# Patient Record
Sex: Female | Born: 1960 | Race: White | Hispanic: No | Marital: Married | Smoking: Never smoker
Health system: Southern US, Community
[De-identification: ages and names within clinical notes are randomized; demographics above are authoritative.]

## PROBLEM LIST (undated history)

## (undated) DIAGNOSIS — E079 Disorder of thyroid, unspecified: Secondary | ICD-10-CM

## (undated) DIAGNOSIS — N390 Urinary tract infection, site not specified: Secondary | ICD-10-CM

## (undated) DIAGNOSIS — E119 Type 2 diabetes mellitus without complications: Secondary | ICD-10-CM

## (undated) DIAGNOSIS — E785 Hyperlipidemia, unspecified: Secondary | ICD-10-CM

## (undated) DIAGNOSIS — Z87442 Personal history of urinary calculi: Secondary | ICD-10-CM

## (undated) DIAGNOSIS — I10 Essential (primary) hypertension: Secondary | ICD-10-CM

## (undated) HISTORY — PX: TONSILLECTOMY: SUR1361

## (undated) HISTORY — PX: URETHRA SURGERY: SHX824

## (undated) HISTORY — PX: TYMPANOSTOMY TUBE PLACEMENT: SHX32

---

## 1998-03-14 ENCOUNTER — Other Ambulatory Visit: Admission: RE | Admit: 1998-03-14 | Discharge: 1998-03-14 | Payer: Self-pay | Admitting: Obstetrics and Gynecology

## 1999-05-06 ENCOUNTER — Other Ambulatory Visit: Admission: RE | Admit: 1999-05-06 | Discharge: 1999-05-06 | Payer: Self-pay | Admitting: Obstetrics and Gynecology

## 2000-06-16 ENCOUNTER — Other Ambulatory Visit: Admission: RE | Admit: 2000-06-16 | Discharge: 2000-06-16 | Payer: Self-pay | Admitting: Obstetrics and Gynecology

## 2001-08-20 ENCOUNTER — Other Ambulatory Visit: Admission: RE | Admit: 2001-08-20 | Discharge: 2001-08-20 | Payer: Self-pay | Admitting: Obstetrics and Gynecology

## 2002-09-02 ENCOUNTER — Ambulatory Visit (HOSPITAL_COMMUNITY): Admission: RE | Admit: 2002-09-02 | Discharge: 2002-09-02 | Payer: Self-pay | Admitting: *Deleted

## 2002-09-02 ENCOUNTER — Encounter (INDEPENDENT_AMBULATORY_CARE_PROVIDER_SITE_OTHER): Payer: Self-pay | Admitting: *Deleted

## 2004-11-16 ENCOUNTER — Encounter: Admission: RE | Admit: 2004-11-16 | Discharge: 2004-11-16 | Payer: Self-pay | Admitting: *Deleted

## 2009-12-15 ENCOUNTER — Emergency Department (HOSPITAL_COMMUNITY): Admission: EM | Admit: 2009-12-15 | Discharge: 2009-12-16 | Payer: Self-pay | Admitting: Emergency Medicine

## 2010-05-29 LAB — CBC
HCT: 40.1 % (ref 36.0–46.0)
Hemoglobin: 14.1 g/dL (ref 12.0–15.0)
MCH: 34.5 pg — ABNORMAL HIGH (ref 26.0–34.0)
MCHC: 35.1 g/dL (ref 30.0–36.0)
MCV: 98.2 fL (ref 78.0–100.0)

## 2010-05-29 LAB — URINALYSIS, ROUTINE W REFLEX MICROSCOPIC
Ketones, ur: NEGATIVE mg/dL
Leukocytes, UA: NEGATIVE
Nitrite: NEGATIVE
Protein, ur: 100 mg/dL — AB
Urobilinogen, UA: 0.2 mg/dL (ref 0.0–1.0)

## 2010-05-29 LAB — COMPREHENSIVE METABOLIC PANEL
ALT: 31 U/L (ref 0–35)
BUN: 28 mg/dL — ABNORMAL HIGH (ref 6–23)
CO2: 23 mEq/L (ref 19–32)
Calcium: 9.4 mg/dL (ref 8.4–10.5)
GFR calc non Af Amer: 45 mL/min — ABNORMAL LOW (ref >60)
Glucose, Bld: 213 mg/dL — ABNORMAL HIGH (ref 70–99)
Sodium: 138 mEq/L (ref 135–145)

## 2010-05-29 LAB — D-DIMER, QUANTITATIVE: D-Dimer, Quant: 0.54 ug/mL-FEU — ABNORMAL HIGH (ref 0.00–0.48)

## 2010-05-29 LAB — DIFFERENTIAL
Basophils Relative: 0 % (ref 0–1)
Eosinophils Absolute: 0 10*3/uL (ref 0.0–0.7)
Lymphs Abs: 0.9 10*3/uL (ref 0.7–4.0)
Neutro Abs: 10.7 10*3/uL — ABNORMAL HIGH (ref 1.7–7.7)
Neutrophils Relative %: 87 % — ABNORMAL HIGH (ref 43–77)

## 2010-05-29 LAB — POCT PREGNANCY, URINE: Preg Test, Ur: NEGATIVE

## 2010-08-02 NOTE — Op Note (Signed)
Elizabeth Hendricks, Elizabeth Hendricks                           ACCOUNT NO.:  [id number]   MEDICAL RECORD NO.:  [id number]                   PATIENT TYPE:  AMB   LOCATION:  DAY                                  FACILITY:  [facility]   PHYSICIAN:  Pershing Cox, M.D.            DATE OF BIRTH:  18-Nov-1960   DATE OF PROCEDURE:  09/02/2002  DATE OF DISCHARGE:                                 OPERATIVE REPORT   .   PREOPERATIVE DIAGNOSIS:  Polyp appearing from the cervix.   POSTOPERATIVE DIAGNOSIS:  Upper endocervical polyp.   PROCEDURE:  1. Exam under anesthesia.  2. Fractional D&C.  3. Diagnostic hysteroscopy.   ANESTHESIA:  General by LMA.   SURGEON:  Pershing Cox, M.D.   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  She has been on Alesse as an oral  contraceptive.  One month ago, she developed heavy vaginal bleeding and was  seen at [facility]  She was sent to [place] for evaluation.  In my office, I was  able to discern a polyp coming from the cervix.  Because of significant  introital limitations as well as vaginismus, I was not able to perform an  office procedure.  The patient is brought to the operating room today for  excision of this polyp and hysteroscopy.   FINDINGS:  Polypoid mass arising from the upper endocervix.  The uterine  cavity sounded to three inches.  There was polypoid endometrium lining the  endometrial cavity.  No other abnormalities were found.   DESCRIPTION OF PROCEDURE:  Elizabeth Hendricks is brought to the operating room with  an IV in place.  She had received 1 g of Ancef in the holding area.  Supine  on the OR table, IV sedation was administered, and then LMA was then placed  to protect her airway.  She was then placed into Allen stirrups and prepped  with a solution of Hibiclens and draped with sterile linens.  During the  prep of  her vagina, the major portion of the polyp was evulsed and removed  for later incorporation with the surgical specimen.   Tiny bivalve speculum was inserted into the vagina.  The cervix was  visualized, and 0.25% Marcaine was injected into the anterior cervix.  Marcaine paracervical block was administered to the 3, 4, 7, and 8 positions  using a total volume of 8 mL.  Kevorkian curette was used to obtain  endocervical curettings.  The uterine sound had passed to a depth of three  inches.  Using serial Pratt dilators, the cervix was dilated to size 25, and  the diagnostic scope was introduced.  Using through-and-through sorbitol  irrigation, the cavity was visualized, and photographs were made of the  polypoid endometrium.  Insertion site  of the polyp appeared to be coming  from the right upper endocervix.  The hysteroscope was removed and using a  small, sharp curette, the lower cervix was curetted, and the base of the  polyp was removed.  Next, the small, sharp curette was used to serially  curette the uterine wall, followed by a Meigs  curettage.  Using these two forms, the tissue collected onto Telfa was then  submitted as endometrial curettings.  The hysteroscope was re-introduced.  There were no additional findings.  The uterus sounded again to three  inches, and the procedure was terminated.                                               Pershing Cox, M.D.    MAJ/MEDQ  D:  09/02/2002  T:  09/02/2002  Job:  [id number]   cc:   Gabriel Earing, M.D.  [address]  [place]  [place] [address]  Fax: [phone]

## 2016-06-18 ENCOUNTER — Ambulatory Visit
Admission: RE | Admit: 2016-06-18 | Discharge: 2016-06-18 | Disposition: A | Discharge status: Home / Self Care | Payer: BLUE CROSS/BLUE SHIELD | Source: Ambulatory Visit | Attending: Internal Medicine | Admitting: Internal Medicine

## 2016-06-18 ENCOUNTER — Other Ambulatory Visit: Payer: Self-pay | Admitting: Internal Medicine

## 2016-06-18 DIAGNOSIS — M25561 Pain in right knee: Secondary | ICD-10-CM

## 2016-06-18 DIAGNOSIS — M542 Cervicalgia: Secondary | ICD-10-CM

## 2016-06-18 DIAGNOSIS — M25511 Pain in right shoulder: Secondary | ICD-10-CM

## 2016-07-15 DIAGNOSIS — Z87442 Personal history of urinary calculi: Secondary | ICD-10-CM

## 2016-07-15 DIAGNOSIS — N390 Urinary tract infection, site not specified: Secondary | ICD-10-CM

## 2016-07-15 HISTORY — DX: Urinary tract infection, site not specified: N39.0

## 2016-07-15 HISTORY — DX: Personal history of urinary calculi: Z87.442

## 2016-08-11 ENCOUNTER — Emergency Department (HOSPITAL_COMMUNITY): Payer: BLUE CROSS/BLUE SHIELD

## 2016-08-11 ENCOUNTER — Encounter (HOSPITAL_COMMUNITY)
Admission: EM | Disposition: A | Discharge status: Home / Self Care | Payer: Self-pay | Source: Home / Self Care | Attending: Internal Medicine

## 2016-08-11 ENCOUNTER — Inpatient Hospital Stay (HOSPITAL_COMMUNITY)
Admission: EM | Admit: 2016-08-11 | Discharge: 2016-08-18 | DRG: 669 | Disposition: A | Discharge status: Home / Self Care | Payer: BLUE CROSS/BLUE SHIELD | Attending: Internal Medicine | Admitting: Internal Medicine

## 2016-08-11 ENCOUNTER — Encounter (HOSPITAL_COMMUNITY): Payer: Self-pay | Admitting: Emergency Medicine

## 2016-08-11 ENCOUNTER — Inpatient Hospital Stay (HOSPITAL_COMMUNITY): Payer: BLUE CROSS/BLUE SHIELD | Admitting: Certified Registered Nurse Anesthetist

## 2016-08-11 ENCOUNTER — Inpatient Hospital Stay (HOSPITAL_COMMUNITY): Payer: BLUE CROSS/BLUE SHIELD

## 2016-08-11 DIAGNOSIS — R52 Pain, unspecified: Secondary | ICD-10-CM

## 2016-08-11 DIAGNOSIS — W19XXXA Unspecified fall, initial encounter: Secondary | ICD-10-CM | POA: Diagnosis not present

## 2016-08-11 DIAGNOSIS — I10 Essential (primary) hypertension: Secondary | ICD-10-CM | POA: Diagnosis present

## 2016-08-11 DIAGNOSIS — R0602 Shortness of breath: Secondary | ICD-10-CM

## 2016-08-11 DIAGNOSIS — N211 Calculus in urethra: Secondary | ICD-10-CM | POA: Diagnosis present

## 2016-08-11 DIAGNOSIS — E785 Hyperlipidemia, unspecified: Secondary | ICD-10-CM | POA: Diagnosis present

## 2016-08-11 DIAGNOSIS — E039 Hypothyroidism, unspecified: Secondary | ICD-10-CM

## 2016-08-11 DIAGNOSIS — N132 Hydronephrosis with renal and ureteral calculous obstruction: Secondary | ICD-10-CM | POA: Diagnosis present

## 2016-08-11 DIAGNOSIS — R112 Nausea with vomiting, unspecified: Secondary | ICD-10-CM | POA: Diagnosis not present

## 2016-08-11 DIAGNOSIS — Z87442 Personal history of urinary calculi: Secondary | ICD-10-CM | POA: Diagnosis not present

## 2016-08-11 DIAGNOSIS — Z7982 Long term (current) use of aspirin: Secondary | ICD-10-CM

## 2016-08-11 DIAGNOSIS — E118 Type 2 diabetes mellitus with unspecified complications: Secondary | ICD-10-CM | POA: Diagnosis present

## 2016-08-11 DIAGNOSIS — N179 Acute kidney failure, unspecified: Secondary | ICD-10-CM | POA: Diagnosis not present

## 2016-08-11 DIAGNOSIS — Y92239 Unspecified place in hospital as the place of occurrence of the external cause: Secondary | ICD-10-CM | POA: Diagnosis not present

## 2016-08-11 DIAGNOSIS — R509 Fever, unspecified: Secondary | ICD-10-CM | POA: Diagnosis present

## 2016-08-11 DIAGNOSIS — N10 Acute tubulo-interstitial nephritis: Secondary | ICD-10-CM | POA: Diagnosis present

## 2016-08-11 DIAGNOSIS — B9620 Unspecified Escherichia coli [E. coli] as the cause of diseases classified elsewhere: Secondary | ICD-10-CM | POA: Diagnosis present

## 2016-08-11 DIAGNOSIS — E1165 Type 2 diabetes mellitus with hyperglycemia: Secondary | ICD-10-CM | POA: Diagnosis present

## 2016-08-11 DIAGNOSIS — I2510 Atherosclerotic heart disease of native coronary artery without angina pectoris: Secondary | ICD-10-CM | POA: Diagnosis present

## 2016-08-11 DIAGNOSIS — N3001 Acute cystitis with hematuria: Secondary | ICD-10-CM | POA: Diagnosis not present

## 2016-08-11 DIAGNOSIS — M79602 Pain in left arm: Secondary | ICD-10-CM | POA: Diagnosis not present

## 2016-08-11 DIAGNOSIS — R197 Diarrhea, unspecified: Secondary | ICD-10-CM | POA: Diagnosis not present

## 2016-08-11 DIAGNOSIS — N390 Urinary tract infection, site not specified: Secondary | ICD-10-CM | POA: Diagnosis present

## 2016-08-11 DIAGNOSIS — N131 Hydronephrosis with ureteral stricture, not elsewhere classified: Secondary | ICD-10-CM | POA: Diagnosis present

## 2016-08-11 DIAGNOSIS — E871 Hypo-osmolality and hyponatremia: Secondary | ICD-10-CM | POA: Diagnosis not present

## 2016-08-11 DIAGNOSIS — Z7984 Long term (current) use of oral hypoglycemic drugs: Secondary | ICD-10-CM | POA: Diagnosis not present

## 2016-08-11 DIAGNOSIS — E8770 Fluid overload, unspecified: Secondary | ICD-10-CM | POA: Diagnosis not present

## 2016-08-11 HISTORY — DX: Disorder of thyroid, unspecified: E07.9

## 2016-08-11 HISTORY — PX: CYSTOSCOPY: SUR368

## 2016-08-11 HISTORY — DX: Urinary tract infection, site not specified: N39.0

## 2016-08-11 HISTORY — DX: Type 2 diabetes mellitus without complications: E11.9

## 2016-08-11 HISTORY — DX: Essential (primary) hypertension: I10

## 2016-08-11 HISTORY — DX: Hyperlipidemia, unspecified: E78.5

## 2016-08-11 HISTORY — DX: Personal history of urinary calculi: Z87.442

## 2016-08-11 HISTORY — PX: CYSTOSCOPY W/ URETERAL STENT PLACEMENT: SHX1429

## 2016-08-11 LAB — CBC
HEMATOCRIT: 34.4 % — AB (ref 36.0–46.0)
Hemoglobin: 11.9 g/dL — ABNORMAL LOW (ref 12.0–15.0)
MCH: 31.9 pg (ref 26.0–34.0)
MCHC: 34.6 g/dL (ref 30.0–36.0)
MCV: 92.2 fL (ref 78.0–100.0)
PLATELETS: 126 10*3/uL — AB (ref 150–400)
RBC: 3.73 MIL/uL — ABNORMAL LOW (ref 3.87–5.11)
RDW: 13.6 % (ref 11.5–15.5)
WBC: 17 10*3/uL — AB (ref 4.0–10.5)

## 2016-08-11 LAB — COMPREHENSIVE METABOLIC PANEL
ALBUMIN: 3 g/dL — AB (ref 3.5–5.0)
ALT: 39 U/L (ref 14–54)
AST: 40 U/L (ref 15–41)
Alkaline Phosphatase: 87 U/L (ref 38–126)
Anion gap: 14 (ref 5–15)
BILIRUBIN TOTAL: 1.2 mg/dL (ref 0.3–1.2)
BUN: 59 mg/dL — AB (ref 6–20)
CHLORIDE: 98 mmol/L — AB (ref 101–111)
CO2: 16 mmol/L — ABNORMAL LOW (ref 22–32)
CREATININE: 2.9 mg/dL — AB (ref 0.44–1.00)
Calcium: 8.5 mg/dL — ABNORMAL LOW (ref 8.9–10.3)
GFR calc Af Amer: 20 mL/min — ABNORMAL LOW (ref >60)
GFR, EST NON AFRICAN AMERICAN: 17 mL/min — AB (ref >60)
GLUCOSE: 259 mg/dL — AB (ref 65–99)
POTASSIUM: 4 mmol/L (ref 3.5–5.1)
Sodium: 128 mmol/L — ABNORMAL LOW (ref 135–145)
TOTAL PROTEIN: 6.6 g/dL (ref 6.5–8.1)

## 2016-08-11 LAB — BASIC METABOLIC PANEL
ANION GAP: 11 (ref 5–15)
BUN: 58 mg/dL — AB (ref 6–20)
CHLORIDE: 103 mmol/L (ref 101–111)
CO2: 16 mmol/L — ABNORMAL LOW (ref 22–32)
Calcium: 7.7 mg/dL — ABNORMAL LOW (ref 8.9–10.3)
Creatinine, Ser: 2.55 mg/dL — ABNORMAL HIGH (ref 0.44–1.00)
GFR calc Af Amer: 23 mL/min — ABNORMAL LOW (ref >60)
GFR calc non Af Amer: 20 mL/min — ABNORMAL LOW (ref >60)
Glucose, Bld: 357 mg/dL — ABNORMAL HIGH (ref 65–99)
Potassium: 4.4 mmol/L (ref 3.5–5.1)
SODIUM: 130 mmol/L — AB (ref 135–145)

## 2016-08-11 LAB — URINALYSIS, ROUTINE W REFLEX MICROSCOPIC
Bilirubin Urine: NEGATIVE
GLUCOSE, UA: NEGATIVE mg/dL
Ketones, ur: NEGATIVE mg/dL
Nitrite: NEGATIVE
PH: 5 (ref 5.0–8.0)
Protein, ur: 100 mg/dL — AB
SPECIFIC GRAVITY, URINE: 1.016 (ref 1.005–1.030)
SQUAMOUS EPITHELIAL / LPF: NONE SEEN

## 2016-08-11 LAB — LIPASE, BLOOD: Lipase: 15 U/L (ref 11–51)

## 2016-08-11 LAB — GLUCOSE, CAPILLARY
GLUCOSE-CAPILLARY: 252 mg/dL — AB (ref 65–99)
GLUCOSE-CAPILLARY: 326 mg/dL — AB (ref 65–99)
Glucose-Capillary: 326 mg/dL — ABNORMAL HIGH (ref 65–99)
Glucose-Capillary: 341 mg/dL — ABNORMAL HIGH (ref 65–99)

## 2016-08-11 LAB — I-STAT CG4 LACTIC ACID, ED: Lactic Acid, Venous: 1.51 mmol/L (ref 0.5–1.9)

## 2016-08-11 SURGERY — CYSTOSCOPY, WITH RETROGRADE PYELOGRAM AND URETERAL STENT INSERTION
Anesthesia: General | Site: Ureter | Laterality: Left

## 2016-08-11 MED ORDER — SODIUM CHLORIDE 0.9% FLUSH
3.0000 mL | Freq: Two times a day (BID) | INTRAVENOUS | Status: DC
  Administered 2016-08-12 – 2016-08-17 (×8): 3 mL via INTRAVENOUS
  Administered 2016-08-18: 09:00:00 via INTRAVENOUS

## 2016-08-11 MED ORDER — SUCCINYLCHOLINE CHLORIDE 200 MG/10ML IV SOSY
PREFILLED_SYRINGE | INTRAVENOUS | Status: DC | PRN
  Administered 2016-08-11: 100 mg via INTRAVENOUS

## 2016-08-11 MED ORDER — FAMOTIDINE 20 MG PO TABS
20.0000 mg | ORAL_TABLET | Freq: Every day | ORAL | Status: DC
  Administered 2016-08-11 – 2016-08-18 (×8): 20 mg via ORAL
  Filled 2016-08-11 (×8): qty 1

## 2016-08-11 MED ORDER — STERILE WATER FOR IRRIGATION IR SOLN
Status: DC | PRN
  Administered 2016-08-11: 3000 mL

## 2016-08-11 MED ORDER — ATENOLOL 50 MG PO TABS
50.0000 mg | ORAL_TABLET | Freq: Two times a day (BID) | ORAL | Status: DC
  Administered 2016-08-11 – 2016-08-18 (×14): 50 mg via ORAL
  Filled 2016-08-11 (×14): qty 1

## 2016-08-11 MED ORDER — ROCURONIUM BROMIDE 10 MG/ML (PF) SYRINGE
PREFILLED_SYRINGE | INTRAVENOUS | Status: AC
  Filled 2016-08-11: qty 5

## 2016-08-11 MED ORDER — PROMETHAZINE HCL 25 MG/ML IJ SOLN: 6.2500 mg | INTRAMUSCULAR | Status: DC | PRN

## 2016-08-11 MED ORDER — PROPOFOL 10 MG/ML IV BOLUS
INTRAVENOUS | Status: AC
  Filled 2016-08-11: qty 20

## 2016-08-11 MED ORDER — ONDANSETRON HCL 4 MG/2ML IJ SOLN
INTRAMUSCULAR | Status: DC | PRN
  Administered 2016-08-11: 4 mg via INTRAVENOUS

## 2016-08-11 MED ORDER — ONDANSETRON HCL 4 MG/2ML IJ SOLN
INTRAMUSCULAR | Status: AC
  Filled 2016-08-11: qty 2

## 2016-08-11 MED ORDER — FENTANYL CITRATE (PF) 100 MCG/2ML IJ SOLN: 25.0000 ug | INTRAMUSCULAR | Status: DC | PRN

## 2016-08-11 MED ORDER — IOPAMIDOL (ISOVUE-300) INJECTION 61%
INTRAVENOUS | Status: DC | PRN
  Administered 2016-08-11: 10 mL via URETHRAL

## 2016-08-11 MED ORDER — FENTANYL CITRATE (PF) 250 MCG/5ML IJ SOLN
INTRAMUSCULAR | Status: AC
  Filled 2016-08-11: qty 5

## 2016-08-11 MED ORDER — CEFTRIAXONE SODIUM 1 G IJ SOLR
1.0000 g | Freq: Once | INTRAMUSCULAR | Status: AC
  Administered 2016-08-11: 1 g via INTRAVENOUS
  Filled 2016-08-11: qty 10

## 2016-08-11 MED ORDER — PHENYLEPHRINE 40 MCG/ML (10ML) SYRINGE FOR IV PUSH (FOR BLOOD PRESSURE SUPPORT)
PREFILLED_SYRINGE | INTRAVENOUS | Status: AC
  Filled 2016-08-11: qty 10

## 2016-08-11 MED ORDER — ACETAMINOPHEN 650 MG RE SUPP: 650.0000 mg | Freq: Four times a day (QID) | RECTAL | Status: DC | PRN

## 2016-08-11 MED ORDER — DEXTROSE 5 % IV SOLN
1.0000 g | INTRAVENOUS | Status: DC
  Administered 2016-08-12 – 2016-08-17 (×6): 1 g via INTRAVENOUS
  Filled 2016-08-11 (×6): qty 10

## 2016-08-11 MED ORDER — SUCCINYLCHOLINE CHLORIDE 200 MG/10ML IV SOSY
PREFILLED_SYRINGE | INTRAVENOUS | Status: AC
  Filled 2016-08-11: qty 10

## 2016-08-11 MED ORDER — SIMVASTATIN 20 MG PO TABS
20.0000 mg | ORAL_TABLET | Freq: Every day | ORAL | Status: DC
  Administered 2016-08-11 – 2016-08-17 (×7): 20 mg via ORAL
  Filled 2016-08-11 (×7): qty 1

## 2016-08-11 MED ORDER — HYDROCHLOROTHIAZIDE 12.5 MG PO CAPS
12.5000 mg | ORAL_CAPSULE | Freq: Every day | ORAL | Status: DC
  Administered 2016-08-12 – 2016-08-13 (×2): 12.5 mg via ORAL
  Filled 2016-08-11 (×2): qty 1

## 2016-08-11 MED ORDER — LOSARTAN POTASSIUM-HCTZ 100-12.5 MG PO TABS: 1.0000 | ORAL_TABLET | Freq: Every day | ORAL | Status: DC

## 2016-08-11 MED ORDER — ONDANSETRON HCL 4 MG/2ML IJ SOLN
4.0000 mg | Freq: Once | INTRAMUSCULAR | Status: AC
  Administered 2016-08-11: 4 mg via INTRAVENOUS
  Filled 2016-08-11: qty 2

## 2016-08-11 MED ORDER — LEVOTHYROXINE SODIUM 125 MCG PO TABS
125.0000 ug | ORAL_TABLET | Freq: Every day | ORAL | Status: DC
  Administered 2016-08-11 – 2016-08-18 (×8): 125 ug via ORAL
  Filled 2016-08-11 (×8): qty 1

## 2016-08-11 MED ORDER — LOSARTAN POTASSIUM 50 MG PO TABS
100.0000 mg | ORAL_TABLET | Freq: Every day | ORAL | Status: DC
  Administered 2016-08-12 – 2016-08-13 (×2): 100 mg via ORAL
  Filled 2016-08-11 (×3): qty 2

## 2016-08-11 MED ORDER — FAMOTIDINE IN NACL 20-0.9 MG/50ML-% IV SOLN
20.0000 mg | INTRAVENOUS | Status: DC
  Filled 2016-08-11: qty 50

## 2016-08-11 MED ORDER — PHENYLEPHRINE 40 MCG/ML (10ML) SYRINGE FOR IV PUSH (FOR BLOOD PRESSURE SUPPORT)
PREFILLED_SYRINGE | INTRAVENOUS | Status: DC | PRN
  Administered 2016-08-11: 120 ug via INTRAVENOUS

## 2016-08-11 MED ORDER — INSULIN ASPART 100 UNIT/ML ~~LOC~~ SOLN
0.0000 [IU] | Freq: Every day | SUBCUTANEOUS | Status: DC
Start: 2016-08-11 — End: 2016-08-18
  Administered 2016-08-11: 4 [IU] via SUBCUTANEOUS
  Administered 2016-08-12: 2 [IU] via SUBCUTANEOUS
  Administered 2016-08-13: 3 [IU] via SUBCUTANEOUS
  Administered 2016-08-14: 2 [IU] via SUBCUTANEOUS
  Administered 2016-08-15 – 2016-08-16 (×2): 3 [IU] via SUBCUTANEOUS

## 2016-08-11 MED ORDER — LACTATED RINGERS IV SOLN
INTRAVENOUS | Status: DC | PRN
  Administered 2016-08-11: 07:00:00 via INTRAVENOUS

## 2016-08-11 MED ORDER — METFORMIN HCL 500 MG PO TABS: 1000.0000 mg | ORAL_TABLET | Freq: Two times a day (BID) | ORAL | Status: DC

## 2016-08-11 MED ORDER — LIDOCAINE 2% (20 MG/ML) 5 ML SYRINGE
INTRAMUSCULAR | Status: AC
  Filled 2016-08-11: qty 5

## 2016-08-11 MED ORDER — HEPARIN SODIUM (PORCINE) 5000 UNIT/ML IJ SOLN
5000.0000 [IU] | Freq: Three times a day (TID) | INTRAMUSCULAR | Status: DC
  Administered 2016-08-11 – 2016-08-18 (×21): 5000 [IU] via SUBCUTANEOUS
  Filled 2016-08-11 (×19): qty 1

## 2016-08-11 MED ORDER — DEXAMETHASONE SODIUM PHOSPHATE 10 MG/ML IJ SOLN
10.0000 mg | Freq: Once | INTRAMUSCULAR | Status: AC
  Administered 2016-08-11: 10 mg via INTRAVENOUS
  Filled 2016-08-11: qty 1

## 2016-08-11 MED ORDER — LOPERAMIDE HCL 2 MG PO CAPS: 4.0000 mg | ORAL_CAPSULE | ORAL | Status: DC | PRN

## 2016-08-11 MED ORDER — PHENAZOPYRIDINE HCL 100 MG PO TABS: 100.0000 mg | ORAL_TABLET | Freq: Three times a day (TID) | ORAL | Status: DC

## 2016-08-11 MED ORDER — ACETAMINOPHEN 325 MG PO TABS
650.0000 mg | ORAL_TABLET | Freq: Once | ORAL | Status: AC
  Administered 2016-08-11: 650 mg via ORAL
  Filled 2016-08-11: qty 2

## 2016-08-11 MED ORDER — ONDANSETRON HCL 4 MG PO TABS: 4.0000 mg | ORAL_TABLET | Freq: Four times a day (QID) | ORAL | Status: DC | PRN

## 2016-08-11 MED ORDER — LIDOCAINE HCL 2 % EX GEL
CUTANEOUS | Status: AC
  Filled 2016-08-11: qty 20

## 2016-08-11 MED ORDER — SODIUM CHLORIDE 0.9 % IV SOLN: INTRAVENOUS | Status: DC

## 2016-08-11 MED ORDER — FAMOTIDINE IN NACL 20-0.9 MG/50ML-% IV SOLN: 20.0000 mg | Freq: Two times a day (BID) | INTRAVENOUS | Status: DC

## 2016-08-11 MED ORDER — FENTANYL CITRATE (PF) 100 MCG/2ML IJ SOLN
INTRAMUSCULAR | Status: DC | PRN
  Administered 2016-08-11: 50 ug via INTRAVENOUS

## 2016-08-11 MED ORDER — HYDRALAZINE HCL 20 MG/ML IJ SOLN: 5.0000 mg | INTRAMUSCULAR | Status: DC | PRN

## 2016-08-11 MED ORDER — PHENAZOPYRIDINE HCL 100 MG PO TABS
100.0000 mg | ORAL_TABLET | Freq: Three times a day (TID) | ORAL | Status: DC | PRN
  Administered 2016-08-11 (×2): 200 mg via ORAL
  Filled 2016-08-11: qty 1
  Filled 2016-08-11 (×6): qty 2

## 2016-08-11 MED ORDER — EPHEDRINE 5 MG/ML INJ
INTRAVENOUS | Status: AC
  Filled 2016-08-11: qty 10

## 2016-08-11 MED ORDER — SODIUM CHLORIDE 0.9 % IV BOLUS (SEPSIS)
1000.0000 mL | Freq: Once | INTRAVENOUS | Status: AC
  Administered 2016-08-11: 1000 mL via INTRAVENOUS

## 2016-08-11 MED ORDER — SODIUM CHLORIDE 0.9 % IV SOLN
INTRAVENOUS | Status: DC
  Administered 2016-08-11 – 2016-08-12 (×3): via INTRAVENOUS

## 2016-08-11 MED ORDER — ONDANSETRON HCL 4 MG/2ML IJ SOLN: 4.0000 mg | Freq: Four times a day (QID) | INTRAMUSCULAR | Status: DC | PRN

## 2016-08-11 MED ORDER — ESMOLOL HCL 100 MG/10ML IV SOLN
INTRAVENOUS | Status: DC | PRN
  Administered 2016-08-11: 20 mg via INTRAVENOUS

## 2016-08-11 MED ORDER — SODIUM BICARBONATE 8.4 % IV SOLN
50.0000 meq | Freq: Once | INTRAVENOUS | Status: DC
  Filled 2016-08-11 (×2): qty 50

## 2016-08-11 MED ORDER — PROPOFOL 10 MG/ML IV BOLUS
INTRAVENOUS | Status: DC | PRN
  Administered 2016-08-11: 150 mg via INTRAVENOUS

## 2016-08-11 MED ORDER — ONDANSETRON HCL 4 MG/2ML IJ SOLN
4.0000 mg | Freq: Four times a day (QID) | INTRAMUSCULAR | Status: DC | PRN
Start: 2016-08-11 — End: 2016-08-18

## 2016-08-11 MED ORDER — SODIUM CHLORIDE 0.9 % IV SOLN: Freq: Once | INTRAVENOUS | Status: DC

## 2016-08-11 MED ORDER — INSULIN ASPART 100 UNIT/ML ~~LOC~~ SOLN
0.0000 [IU] | Freq: Three times a day (TID) | SUBCUTANEOUS | Status: DC
  Administered 2016-08-11 (×2): 7 [IU] via SUBCUTANEOUS
  Administered 2016-08-12: 3 [IU] via SUBCUTANEOUS
  Administered 2016-08-12: 5 [IU] via SUBCUTANEOUS
  Administered 2016-08-12 – 2016-08-13 (×2): 3 [IU] via SUBCUTANEOUS
  Administered 2016-08-13: 2 [IU] via SUBCUTANEOUS
  Administered 2016-08-13: 5 [IU] via SUBCUTANEOUS
  Administered 2016-08-14 (×2): 2 [IU] via SUBCUTANEOUS
  Administered 2016-08-14: 5 [IU] via SUBCUTANEOUS
  Administered 2016-08-15: 2 [IU] via SUBCUTANEOUS
  Administered 2016-08-15: 3 [IU] via SUBCUTANEOUS
  Administered 2016-08-15: 5 [IU] via SUBCUTANEOUS
  Administered 2016-08-16: 2 [IU] via SUBCUTANEOUS
  Administered 2016-08-16: 3 [IU] via SUBCUTANEOUS
  Administered 2016-08-16: 5 [IU] via SUBCUTANEOUS
  Administered 2016-08-17: 9 [IU] via SUBCUTANEOUS
  Administered 2016-08-17: 1 [IU] via SUBCUTANEOUS
  Administered 2016-08-18: 2 [IU] via SUBCUTANEOUS
  Administered 2016-08-18 (×2): 1 [IU] via SUBCUTANEOUS

## 2016-08-11 MED ORDER — ONDANSETRON HCL 4 MG PO TABS
4.0000 mg | ORAL_TABLET | Freq: Four times a day (QID) | ORAL | Status: DC | PRN
Start: 2016-08-11 — End: 2016-08-18

## 2016-08-11 MED ORDER — IOPAMIDOL (ISOVUE-300) INJECTION 61%
INTRAVENOUS | Status: AC
  Filled 2016-08-11: qty 50

## 2016-08-11 MED ORDER — ACETAMINOPHEN 325 MG PO TABS
650.0000 mg | ORAL_TABLET | Freq: Four times a day (QID) | ORAL | Status: DC | PRN
  Administered 2016-08-11 – 2016-08-18 (×16): 650 mg via ORAL
  Filled 2016-08-11 (×16): qty 2

## 2016-08-11 MED ORDER — MIDAZOLAM HCL 5 MG/5ML IJ SOLN
INTRAMUSCULAR | Status: DC | PRN
  Administered 2016-08-11: 1 mg via INTRAVENOUS

## 2016-08-11 MED ORDER — LIDOCAINE HCL (CARDIAC) 20 MG/ML IV SOLN
INTRAVENOUS | Status: DC | PRN
  Administered 2016-08-11: 100 mg via INTRAVENOUS

## 2016-08-11 MED ORDER — ASPIRIN EC 81 MG PO TBEC
81.0000 mg | DELAYED_RELEASE_TABLET | Freq: Every day | ORAL | Status: DC
  Administered 2016-08-11 – 2016-08-18 (×8): 81 mg via ORAL
  Filled 2016-08-11 (×8): qty 1

## 2016-08-11 MED ORDER — ESMOLOL HCL 100 MG/10ML IV SOLN
INTRAVENOUS | Status: AC
  Filled 2016-08-11: qty 10

## 2016-08-11 MED ORDER — PHENYLEPHRINE HCL 10 MG/ML IJ SOLN
INTRAMUSCULAR | Status: DC | PRN
  Administered 2016-08-11: 25 ug/min via INTRAVENOUS

## 2016-08-11 MED ORDER — MIDAZOLAM HCL 2 MG/2ML IJ SOLN
INTRAMUSCULAR | Status: AC
  Filled 2016-08-11: qty 2

## 2016-08-11 SURGICAL SUPPLY — 32 items
ADAPTER CATH URET PLST 4-6FR (CATHETERS) IMPLANT
ADPR CATH URET STRL DISP 4-6FR (CATHETERS)
APL SKNCLS STERI-STRIP NONHPOA (GAUZE/BANDAGES/DRESSINGS)
BAG URINE DRAINAGE (UROLOGICAL SUPPLIES) ×3 IMPLANT
BAG URO CATCHER STRL LF (MISCELLANEOUS) ×3 IMPLANT
BENZOIN TINCTURE PRP APPL 2/3 (GAUZE/BANDAGES/DRESSINGS) IMPLANT
BLADE 10 SAFETY STRL DISP (BLADE) ×3 IMPLANT
BUCKET BIOHAZARD WASTE 5 GAL (MISCELLANEOUS) ×3 IMPLANT
CATH FOLEY 2WAY SLVR  5CC 16FR (CATHETERS)
CATH FOLEY 2WAY SLVR 5CC 16FR (CATHETERS) IMPLANT
CATH INTERMIT  6FR 70CM (CATHETERS) ×2 IMPLANT
CATH URET 5FR 28IN CONE TIP (BALLOONS)
CATH URET 5FR 70CM CONE TIP (BALLOONS) IMPLANT
DRAPE CAMERA CLOSED 9X96 (DRAPES) ×6 IMPLANT
GOWN STRL REUS W/ TWL LRG LVL3 (GOWN DISPOSABLE) ×1 IMPLANT
GOWN STRL REUS W/ TWL XL LVL3 (GOWN DISPOSABLE) ×1 IMPLANT
GOWN STRL REUS W/TWL LRG LVL3 (GOWN DISPOSABLE) ×3
GOWN STRL REUS W/TWL XL LVL3 (GOWN DISPOSABLE) ×3
GUIDEWIRE COOK  .035 (WIRE) IMPLANT
GUIDEWIRE STR DUAL SENSOR (WIRE) ×2 IMPLANT
KIT ROOM TURNOVER OR (KITS) ×3 IMPLANT
NS IRRIG 1000ML POUR BTL (IV SOLUTION) ×6 IMPLANT
PACK CYSTO (CUSTOM PROCEDURE TRAY) ×3 IMPLANT
PAD ARMBOARD 7.5X6 YLW CONV (MISCELLANEOUS) ×6 IMPLANT
PLUG CATH AND CAP STER (CATHETERS) IMPLANT
STENT URET 6FRX24 CONTOUR (STENTS) ×2 IMPLANT
SYRINGE CONTROL L 12CC (SYRINGE) ×3 IMPLANT
SYRINGE CONTROL LL 12CC (SYRINGE) ×1 IMPLANT
SYRINGE TOOMEY DISP (SYRINGE) IMPLANT
UNDERPAD 30X30 (UNDERPADS AND DIAPERS) ×3 IMPLANT
WATER STERILE IRR 1000ML POUR (IV SOLUTION) ×3 IMPLANT
WIRE COONS/BENSON .038X145CM (WIRE) IMPLANT

## 2016-08-11 NOTE — Anesthesia Preprocedure Evaluation (Addendum)
Anesthesia Evaluation  Patient identified by MRN, date of birth, ID band Patient awake    Reviewed: Allergy & Precautions, NPO status , Patient's Chart, lab work & pertinent test results, reviewed documented beta blocker date and time   Airway Mallampati: III  TM Distance: >3 FB Neck ROM: Full    Dental  (+) Teeth Intact, Dental Advisory Given   Pulmonary neg pulmonary ROS,    Pulmonary exam normal breath sounds clear to auscultation       Cardiovascular hypertension, Pt. on home beta blockers and Pt. on medications Normal cardiovascular exam Rhythm:Regular Rate:Normal     Neuro/Psych negative neurological ROS  negative psych ROS   GI/Hepatic negative GI ROS, Neg liver ROS,   Endo/Other  diabetes, Type 2, Oral Hypoglycemic AgentsHypothyroidism   Renal/GU ARFRenal disease    large left distal ureteral stone, hydronephrosis, and evidence of urinary tract infection    Musculoskeletal negative musculoskeletal ROS (+)   Abdominal   Peds  Hematology  (+) Blood dyscrasia (thrombocytopenia), anemia ,   Anesthesia Other Findings Day of surgery medications reviewed with the patient.  Reproductive/Obstetrics                            Anesthesia Physical Anesthesia Plan  ASA: II and emergent  Anesthesia Plan: General   Post-op Pain Management:    Induction: Intravenous  Airway Management Planned: Oral ETT  Additional Equipment:   Intra-op Plan:   Post-operative Plan: Extubation in OR  Informed Consent: I have reviewed the patients History and Physical, chart, labs and discussed the procedure including the risks, benefits and alternatives for the proposed anesthesia with the patient or authorized representative who has indicated his/her understanding and acceptance.   Dental advisory given  Plan Discussed with: CRNA  Anesthesia Plan Comments: (Risks/benefits of general anesthesia  discussed with patient including risk of damage to teeth, lips, gum, and tongue, nausea/vomiting, allergic reactions to medications, and the possibility of heart attack, stroke and death.  All patient questions answered.  Patient wishes to proceed.)        Anesthesia Quick Evaluation

## 2016-08-11 NOTE — Discharge Instructions (Signed)

## 2016-08-11 NOTE — Op Note (Signed)
Preoperative diagnosis: Ureteral stone  Postoperative diagnosis: Same   Procedure: Cystoscopy, left retrograde pyelogram, fluoroscopic interpretation, placement of double-J stent and left ureter (24 centimeter by 6 French contour without tether)    Surgeon: Bertram MillardStephen M. Jeven Topper, M.D.   Anesthesia: Gen.   Complications: None  Specimen(s): None  Drain(s): 24 centimeter by 6 French contour double-J stent, without tether  Indications: 56 year old female presenting to the emergency room this morning with an obstructed, infected left distal ureteral stone.  Because of her concurrent obstruction and infection, it was recommended that she undergo urgent decompression of her left renal unit with stent.  Patient and her husband were counseled on this, they understand the procedure as well as risks, occasions and desire to proceed.    Technique and findings: The patient was properly identified and marked in the holding area.  She was taken to the operating room where general endotracheal anesthetic was administered.Marland Kitchen.  She was placed in the dorsolithotomy position.  Genitalia and perineum were prepped and draped.  Proper timeout was performed.  A 21 French panendoscope was advanced into her bladder which was inspected circumferentially.  Urothelium was normal.  Ureteral orifices were normal in configuration and location.  The left ureteral orifice was cannulated with a 6 [place]French open-ended catheter.  Using on the pain, retrograde ureteropyelogram was performed.  There were obvious filling defects in the distal ureter consistent with the 2 stones seen on the CT scan.  The ureter proximal to this was widely dilated, and because of the capacious nature of this, the ureter was unable to be filled totally with contrast.  However, it was evident that there were 2 stones in the distal ureter.  I then passed a 0.038 inch sensor-tip guidewire through the open-ended catheter, and passed it proximally into the upper  pole calyceal system using fluoroscopic guidance.  The open-ended catheter was removed.  A 24 centimeter 6 French contour double-J stent, with the string removed, was then advanced over top of the guidewire.  Using fluoroscopic and cystoscopic guidance, it was positioned, and then deployed with the guidewire removed.  Good proximal and distal curls were seen on the stent using fluoroscopic and cystoscopic visualization, respectively.  At this point, the bladder was drained.  The scope was removed.  The patient was then awakened and taken to the PACU in stable condition.  She tolerated the procedure well.

## 2016-08-11 NOTE — Transfer of Care (Signed)
Immediate Anesthesia Transfer of Care Note  Patient: Elizabeth Hendricks  Procedure(s) Performed: Procedure(s): CYSTOSCOPY WITH RETROGRADE PYELOGRAM/URETERAL STENT PLACEMENT (Left)  Patient Location: PACU  Anesthesia Type:General  Level of Consciousness: awake and patient cooperative  Airway & Oxygen Therapy: Patient Spontanous Breathing and Patient connected to nasal cannula oxygen  Post-op Assessment: Report given to RN and Post -op Vital signs reviewed and stable  Post vital signs: Reviewed and stable  Last Vitals:  Vitals:   08/11/16 0836 08/11/16 0915  BP:  93/62  Pulse: 87 88  Resp: (!) 27 (!) 23  Temp: 36.3 C 36.8 C    Last Pain:  Vitals:   08/11/16 1158  TempSrc:   PainSc: 0-No pain         Complications: No apparent anesthesia complications

## 2016-08-11 NOTE — ED Provider Notes (Signed)
[facility]-EMERGENCY DEPT Provider Note   CSN: [id number] Arrival date & time: 08/11/16  7829     History   Chief Complaint Chief Complaint  Patient presents with  . Abdominal Pain    HPI Elizabeth Hendricks is a 56 y.o. female.  Patient with a history of DM, thyroid disease presents with 3 days of vomiting (2-3 episodes per day) and diarrhea (2-3 episodes per day). No fever or known sick contacts. No hematemesis or bloody stools. She reports having abdominal pain tonight prompting emergency room evaluation. No chest pain, SOB or cough. No urinary symptoms of dysuria but she endorses decreased urination.       Past Medical History:  Diagnosis Date  . Diabetes mellitus without complication (HCC)   . Thyroid disease     There are no active problems to display for this patient.   History reviewed. No pertinent surgical history.  OB History    No data available       Home Medications    Prior to Admission medications   Not on File    Family History No family history on file.  Social History Social History  Substance Use Topics  . Smoking status: Never Smoker  . Smokeless tobacco: Never Used  . Alcohol use No     Allergies   Patient has no allergy information on record.   Review of Systems Review of Systems  Constitutional: Positive for activity change. Negative for chills and fever.  HENT: Negative.   Respiratory: Negative.   Cardiovascular: Negative.   Gastrointestinal: Positive for abdominal pain, diarrhea, nausea and vomiting. Negative for blood in stool.  Genitourinary: Positive for decreased urine volume. Negative for dysuria.  Musculoskeletal: Negative.  Negative for myalgias.  Skin: Negative.   Neurological: Negative.   Psychiatric/Behavioral: Negative for confusion.     Physical Exam Updated Vital Signs BP 105/74   Pulse (!) 103   Temp 98.3 F (36.8 C) (Oral)   Resp (!) 28   Ht 4\' 11"  (1.499 m)   Wt 64.9 kg (143 lb)   SpO2 91%   BMI  28.88 kg/m   Physical Exam  Constitutional: She is oriented to person, place, and time. She appears well-developed and well-nourished.  HENT:  Head: Normocephalic.  Oral mucosa extremely dry.  Neck: Normal range of motion. Neck supple.  Cardiovascular: Regular rhythm.  Tachycardia present.   Pulmonary/Chest: Effort normal and breath sounds normal. She has no wheezes. She has no rales. She exhibits no tenderness.  Abdominal: Soft. Bowel sounds are normal. There is no tenderness. There is no rebound and no guarding.  Musculoskeletal: Normal range of motion. She exhibits no edema.  Neurological: She is alert and oriented to person, place, and time.  Skin: Skin is warm and dry. No rash noted.  Psychiatric: She has a normal mood and affect.     ED Treatments / Results  Labs (all labs ordered are listed, but only abnormal results are displayed) Labs Reviewed  COMPREHENSIVE METABOLIC PANEL - Abnormal; Notable for the following:       Result Value   Sodium 128 (*)    Chloride 98 (*)    CO2 16 (*)    Glucose, Bld 259 (*)    BUN 59 (*)    Creatinine, Ser 2.90 (*)    Calcium 8.5 (*)    Albumin 3.0 (*)    GFR calc non Af Amer 17 (*)    GFR calc Af Amer 20 (*)    All other  components within normal limits  CBC - Abnormal; Notable for the following:    WBC 17.0 (*)    RBC 3.73 (*)    Hemoglobin 11.9 (*)    HCT 34.4 (*)    Platelets 126 (*)    All other components within normal limits  LIPASE, BLOOD  URINALYSIS, ROUTINE W REFLEX MICROSCOPIC  I-STAT CG4 LACTIC ACID, ED    EKG  EKG Interpretation None       Radiology Dg Chest 2 View  Result Date: 08/11/2016 CLINICAL DATA:  Acute onset of shortness of breath and generalized chest pain. Initial encounter. EXAM: CHEST  2 VIEW COMPARISON:  CTA of the chest performed 12/16/2009 FINDINGS: The lungs are well-aerated. Mild left basilar airspace opacity may reflect atelectasis or possibly mild pneumonia. There is no evidence of pleural  effusion or pneumothorax. The heart is borderline normal in size. No acute osseous abnormalities are seen. IMPRESSION: Mild left basilar airspace opacity may reflect atelectasis or possibly mild pneumonia. Electronically Signed   By: Roanna RaiderJeffery  Chang M.D.   On: 08/11/2016 04:43    Procedures Procedures (including critical care time)  Medications Ordered in ED Medications  sodium chloride 0.9 % bolus 1,000 mL (1,000 mLs Intravenous New Bag/Given 08/11/16 0317)  ondansetron (ZOFRAN) injection 4 mg (4 mg Intravenous Given 08/11/16 0317)  sodium chloride 0.9 % bolus 1,000 mL (1,000 mLs Intravenous New Bag/Given 08/11/16 0450)  dexamethasone (DECADRON) injection 10 mg (10 mg Intravenous Given 08/11/16 0450)     Initial Impression / Assessment and Plan / ED Course  I have reviewed the triage vital signs and the nursing notes.  Pertinent labs & imaging results that were available during my care of the patient were reviewed by me and considered in my medical decision making (see chart for details).     Patient presents with c/o vomiting and diarrhea for 3 days, no fever. Very dry appearing. IVF's bolusing.  She is found to have a UTI. Rocephin started. CT scan results obtained and show a large, obstructing 1 x 0.7 CM stone in the distal left ureter with mild hydronephrosis.  In the setting of infection and renal dysfunction, urology paged to consult. Discussed with TRH, Dr. Robb Matarrtiz, who accepts the patient on to his service.   The patient is resting comfortably.   Final Clinical Impressions(s) / ED Diagnoses   Final diagnoses:  None   1. Left ureteral stone with infection 2. UTI 3. AKI  New Prescriptions New Prescriptions   No medications on file     Elpidio AnisUpstill, Major [place], Cordelia Poche-C 08/11/16 0550    Dione BoozeGlick, David, MD 08/11/16 [phone]

## 2016-08-11 NOTE — H&P (Signed)
History and Physical    Smith Mincemy L Carrick ZOX:[id number] DOB: February 24, 1961 DOA: 08/11/2016  PCP: Patient, No Pcp Per Patient coming from: Home  Chief Complaint: N/V/D Abd pain  HPI: Elizabeth Hendricks is a 56 y.o. female with medical history significant of DM and hypothyroidism, HTN, HLD. History limited secondary to patient being in a postoperative, post anesthesia state. Per review of her medical notes and discussion with the patient initially present with three-day history of nausea, vomiting and diarrhea. These episodes were nonbilious, nonbloody. Course proximal a 2-3 episodes per day. Symptoms were gettign worse prior to surgery. Nothing makes symptoms better. One day ago patient developed lower middle abdominal pain. Postoperatively pts only complaint is a feeling of needing to urinate all the time. Denies chest pain, palpitations, shortness of breath, fevers, dysuria, back pain, neck stiffness, focal neurological deficit.   ED Course: Objective findings outlined below. Patient found to have, including urological condition. Urology consult and patient taken to the operating room.   Review of Systems: As per HPI otherwise all other systems reviewed and are negative  Ambulatory Status: No restrictions.  Past Medical History:  Diagnosis Date  . Diabetes mellitus without complication (HCC)   . History of kidney stones 07/2016  . HLD (hyperlipidemia)   . Hypertension   . Thyroid disease   . UTI (urinary tract infection) 07/2016    Past Surgical History:  Procedure Laterality Date  . CYSTOSCOPY  08/11/2016   Cystoscopy, left retrograde pyelogram, fluoroscopic interpretation, placement of double-J stent and left ureter (24 centimeter by 6 French contour without tether)    . TYMPANOSTOMY TUBE PLACEMENT    . URETHRA SURGERY     performed when pt was a child.     Social History   Social History  . Marital status: Single    Spouse name: N/A  . Number of children: N/A  . Years of  education: N/A   Occupational History  . Not on file.   Social History Main Topics  . Smoking status: Never Smoker  . Smokeless tobacco: Never Used  . Alcohol use No  . Drug use: No  . Sexual activity: Not on file   Other Topics Concern  . Not on file   Social History Narrative  . No narrative on file    No Known Allergies  History reviewed. No pertinent family history.  Pt denies any family history of Strokes, heart attack, diabetes, hypertension. Patient unsure of other medical conditions exist in her family.   Prior to Admission medications   Medication Sig Start Date End Date Taking? Authorizing Provider  aspirin EC 81 MG tablet Take 81 mg by mouth daily.   Yes [provider]  atenolol (TENORMIN) 50 MG tablet Take 50 mg by mouth 2 (two) times daily.   Yes [provider]  co-enzyme Q-10 30 MG capsule Take 30 mg by mouth daily.   Yes [provider]  ibuprofen (ADVIL,MOTRIN) 200 MG tablet Take 200-800 mg by mouth every 6 (six) hours as needed for moderate pain.   Yes [provider]  levothyroxine (SYNTHROID, LEVOTHROID) 125 MCG tablet Take 125 mcg by mouth daily before breakfast.   Yes [provider]  losartan-hydrochlorothiazide (HYZAAR) 100-12.5 MG tablet Take 1 tablet by mouth daily.   Yes [provider]  metFORMIN (GLUCOPHAGE) 500 MG tablet Take 1,000 mg by mouth 2 (two) times daily with a meal.   Yes [provider]  omega-3 acid ethyl esters (LOVAZA) 1 g capsule  Take 1 g by mouth daily.   Yes [provider]  simvastatin (ZOCOR) 20 MG tablet Take 20 mg by mouth daily.   Yes [provider]    Physical Exam: Vitals:   08/11/16 0815 08/11/16 0829 08/11/16 0836 08/11/16 0915  BP:  93/67  93/62  Pulse: 88 86 87 88  Resp: (!) 25 (!) 28 (!) 27 (!) 23  Temp:   97.3 F (36.3 C) 98.2 F (36.8 C)  TempSrc:    Oral  SpO2: 97% 93% 92% 93%  Weight:      Height:         General:   Appears calm and comfortable Eyes:  PERRL, EOMI, normal lids, iris ENT:  grossly normal hearing, lips & tongue, mmm Neck:  no LAD, masses or thyromegaly Cardiovascular:  RRR, no m/r/g. No LE edema.  Respiratory:  CTA bilaterally, no w/r/r. Normal respiratory effort. Abdomen:  soft, ntnd, NABS Skin:  no rash or induration seen on limited exam Musculoskeletal:  grossly normal tone BUE/BLE, good ROM, no bony abnormality Psychiatric:  grossly normal mood and affect, speech fluent and appropriate, AOx3 Neurologic:  CN 2-12 grossly intact, moves all extremities in coordinated fashion, sensation intact  Labs on Admission: I have personally reviewed following labs and imaging studies  CBC:  Recent Labs Lab 08/11/16 0303  WBC 17.0*  HGB 11.9*  HCT 34.4*  MCV 92.2  PLT 126*   Basic Metabolic Panel:  Recent Labs Lab 08/11/16 0303  NA 128*  K 4.0  CL 98*  CO2 16*  GLUCOSE 259*  BUN 59*  CREATININE 2.90*  CALCIUM 8.5*   GFR: Estimated Creatinine Clearance: 17.7 mL/min (A) (by C-G formula based on SCr of 2.9 mg/dL (H)). Liver Function Tests:  Recent Labs Lab 08/11/16 0303  AST 40  ALT 39  ALKPHOS 87  BILITOT 1.2  PROT 6.6  ALBUMIN 3.0*    Recent Labs Lab 08/11/16 0303  LIPASE 15   No results for input(s): AMMONIA in the last 168 hours. Coagulation Profile: No results for input(s): INR, PROTIME in the last 168 hours. Cardiac Enzymes: No results for input(s): CKTOTAL, CKMB, CKMBINDEX, TROPONINI in the last 168 hours. BNP (last 3 results) No results for input(s): PROBNP in the last 8760 hours. HbA1C: No results for input(s): HGBA1C in the last 72 hours. CBG:  Recent Labs Lab 08/11/16 0802 08/11/16 1147  GLUCAP 252* 326*   Lipid Profile: No results for input(s): CHOL, HDL, LDLCALC, TRIG, CHOLHDL, LDLDIRECT in the last 72 hours. Thyroid Function Tests: No results for input(s): TSH, T4TOTAL, FREET4, T3FREE, THYROIDAB in the last 72 hours. Anemia Panel: No  results for input(s): VITAMINB12, FOLATE, FERRITIN, TIBC, IRON, RETICCTPCT in the last 72 hours. Urine analysis:    Component Value Date/Time   COLORURINE YELLOW 08/11/2016 0430   APPEARANCEUR CLOUDY (A) 08/11/2016 0430   LABSPEC 1.016 08/11/2016 0430   PHURINE 5.0 08/11/2016 0430   GLUCOSEU NEGATIVE 08/11/2016 0430   HGBUR LARGE (A) 08/11/2016 0430   BILIRUBINUR NEGATIVE 08/11/2016 0430   KETONESUR NEGATIVE 08/11/2016 0430   PROTEINUR 100 (A) 08/11/2016 0430   UROBILINOGEN 0.2 12/15/2009 2152   NITRITE NEGATIVE 08/11/2016 0430   LEUKOCYTESUR LARGE (A) 08/11/2016 0430    Creatinine Clearance: Estimated Creatinine Clearance: 17.7 mL/min (A) (by C-G formula based on SCr of 2.9 mg/dL (H)).  Sepsis Labs: @LABRCNTIP (procalcitonin:4,lacticidven:4) )No results found for this or any previous visit (from the past 240 hour(s)).   Radiological Exams on Admission: Ct Abdomen Pelvis  Wo Contrast  Result Date: 08/11/2016 CLINICAL DATA:  Acute onset of generalized abdominal pain. Initial encounter. EXAM: CT ABDOMEN AND PELVIS WITHOUT CONTRAST TECHNIQUE: Multidetector CT imaging of the abdomen and pelvis was performed following the standard protocol without IV contrast. COMPARISON:  CT of the abdomen and pelvis performed 12/15/2009 FINDINGS: Lower chest: A trace left pleural effusion is noted, mildly loculated in appearance, with left basilar airspace opacification. Underlying pneumonia cannot be excluded. Diffuse coronary artery calcifications are seen. Hepatobiliary: The liver is unremarkable in appearance. Stones are seen dependently within the gallbladder. The common bile duct remains normal in caliber. Pancreas: The pancreas is within normal limits. Spleen: The spleen is unremarkable in appearance. Adrenals/Urinary Tract: The adrenal glands are grossly unremarkable in appearance. There is mild left-sided hydronephrosis, with prominence of the left ureter along its entire course. An obstructing large  1.1 x 0.7 cm stone is noted at the distal left ureter, just above the left vesicoureteral junction, with an adjacent smaller 0.5 cm stone seen more proximally. Bilateral perinephric stranding is noted. Nonobstructing bilateral renal stones measure up to 0.7 cm in size. Stomach/Bowel: The stomach is unremarkable in appearance. The small bowel is within normal limits. The appendix is normal in caliber, without evidence of appendicitis. The colon is unremarkable in appearance. Vascular/Lymphatic: Scattered calcification is seen along the abdominal aorta and its branches. The abdominal aorta is otherwise grossly unremarkable. The inferior vena cava is grossly unremarkable. No retroperitoneal lymphadenopathy is seen. No pelvic sidewall lymphadenopathy is identified. Reproductive: The bladder is decompressed and not well assessed. The uterus is grossly unremarkable in appearance. No suspicious adnexal masses are seen. Other: No additional soft tissue abnormalities are seen. Musculoskeletal: No acute osseous abnormalities are identified. Facet disease is noted at the lower lumbar spine. The visualized musculature is unremarkable in appearance. IMPRESSION: 1. Mild left-sided hydronephrosis, with an obstructing large 1.1 x 0.7 cm stone at the distal left ureter, just above the left vesicoureteral junction. Adjacent smaller 0.5 cm stone seen more proximally. 2. Nonobstructing bilateral renal stones measure up to 0.7 cm in size. 3. Trace left pleural effusion, mildly loculated in appearance, with left basilar airspace opacification. Underlying pneumonia cannot be excluded. 4. Diffuse coronary artery calcifications seen. 5. Scattered aortic atherosclerosis. Electronically Signed   By: Roanna Raider M.D.   On: 08/11/2016 04:57   Dg Chest 2 View  Result Date: 08/11/2016 CLINICAL DATA:  Acute onset of shortness of breath and generalized chest pain. Initial encounter. EXAM: CHEST  2 VIEW COMPARISON:  CTA of the chest performed  12/16/2009 FINDINGS: The lungs are well-aerated. Mild left basilar airspace opacity may reflect atelectasis or possibly mild pneumonia. There is no evidence of pleural effusion or pneumothorax. The heart is borderline normal in size. No acute osseous abnormalities are seen. IMPRESSION: Mild left basilar airspace opacity may reflect atelectasis or possibly mild pneumonia. Electronically Signed   By: Roanna Raider M.D.   On: 08/11/2016 04:43   Dg C-arm 1-60 Min-no Report  Result Date: 08/11/2016 Fluoroscopy was utilized by the requesting physician.  No radiographic interpretation.    Assessment/Plan Active Problems:   AKI (acute kidney injury) (HCC)   Hydronephrosis due to obstruction of ureter   Complicated UTI (urinary tract infection)   Nausea vomiting and diarrhea   Hyponatremia   Diabetes mellitus with complication (HCC)   CAD (coronary artery disease)   Hypothyroidism   Ureterolithiasis: Bilateral. CT as above, agree w/ read based on my personal review of films. Associated w/ hydronephrosis. Urology  consulted and took pt to OR for stent placement. Pt seen immediately postoperatively.  - Further mgt per Urology - Pyridium for postoperative urethral and bladder irritation.  UTI: UA grossly abnormal. Likely due in part to above. No evidence of pyelo at time of admission - UCX - Ceftriaxone - IVF - treatment as above  N/V/D: likely related to above. Possible coinfection w/ viral gastro but less likely  - Zofran, Imodium, IVF  AKI: Cr 2.9. Baseline 1.0 per review of records from [facility]. Likely due to fluid loss and hydronephrosis.  - IVF  Hyponatremia: Mild. Corrected Na for hyperglycemia is 131 (128 uncorrected). - IVF - BMET Q12 x2  DM: on metformin only.  - Hold metformin due to AKI - SSI  HTN: - continue atenolol, losartan, HCTZ  HLD: - continue statin  CAD: - continue ASA  Hypothyroid: - continue synthroid      DVT prophylaxis: SCD  Code Status:  FULL  Family Communication: none  Disposition Plan: pending surgical intervention and improvement in overal condition  Consults called: Urology  Admission status: Inpt    MERRELL, DAVID J MD [facility]  If 7PM-7AM, please contact night-coverage [email or web address] Password [facility]  08/11/2016, 11:54 AM

## 2016-08-11 NOTE — Anesthesia Procedure Notes (Addendum)
Procedure Name: Intubation Date/Time: 08/11/2016 7:21 AM Performed by: Willeen Cass P Pre-anesthesia Checklist: Patient identified, Emergency Drugs available, Suction available and Patient being monitored Patient Re-evaluated:Patient Re-evaluated prior to inductionOxygen Delivery Method: Circle system utilized Preoxygenation: Pre-oxygenation with 100% oxygen Intubation Type: IV induction, Rapid sequence and Cricoid Pressure applied Laryngoscope Size: 3 and Mac Grade View: Grade I Tube type: Subglottic suction tube Tube size: 7.0 mm Number of attempts: 1 Airway Equipment and Method: Stylet and Video-laryngoscopy Placement Confirmation: ETT inserted through vocal cords under direct vision,  breath sounds checked- equal and bilateral and CO2 detector Secured at: 22 cm Tube secured with: Tape Dental Injury: Teeth and Oropharynx as per pre-operative assessment

## 2016-08-11 NOTE — Anesthesia Postprocedure Evaluation (Signed)
Anesthesia Post Note  Patient: Elizabeth Hendricks  Procedure(s) Performed: Procedure(s) (LRB): CYSTOSCOPY WITH RETROGRADE PYELOGRAM/URETERAL STENT PLACEMENT (Left)  Patient location during evaluation: PACU Anesthesia Type: General Level of consciousness: awake and alert Pain management: pain level controlled Vital Signs Assessment: post-procedure vital signs reviewed and stable Respiratory status: spontaneous breathing, nonlabored ventilation, respiratory function stable and patient connected to nasal cannula oxygen Cardiovascular status: blood pressure returned to baseline and stable Postop Assessment: no signs of nausea or vomiting Anesthetic complications: no       Last Vitals:  Vitals:   08/11/16 0915 08/11/16 1303  BP: 93/62 116/77  Pulse: 88 89  Resp: (!) 23 20  Temp: 36.8 C 37 C    Last Pain:  Vitals:   08/11/16 1303  TempSrc: Oral  PainSc:                  Cecile HearingStephen Edward Ludmila Ebarb

## 2016-08-11 NOTE — Consult Note (Signed)
Urology Consult   Physician requesting consult: Elpidio AnisShari Upstill, PA  Reason for consult: Kidney stone, infection  History of Present Illness: Elizabeth Hendricks is a 56 y.o. female presenting to the emergency room early this morning with left abdominal pain, nausea and vomiting.  She has had this pain for the last few days.  This is been getting worse speech.  She has had decreased by mouth intake and associated nausea and vomiting.  The patient was brought to the emergency room by EMS early this morning when her husband found her basically unresponsive in the bathroom.  She was evaluated in the emergency room and found to have a large left distal ureteral stone, hydronephrosis, and evidence of urinary tract infection.  In the emergency room, the patient was afebrile, but she did have a low blood pressure as well as tachypnea.  Lactic acid is 1.5.  Because of her infection and stones, urology is consulted.  She does have a history, remote, of a kidney stone.  She has not seen a urologist in [place] area.  She did have one UTI treated last year.    Past Medical History:  Diagnosis Date  . Diabetes mellitus without complication (HCC)   . Thyroid disease     History reviewed. No pertinent surgical history.   Current Hospital Medications: Scheduled Meds: Continuous Infusions: . sodium chloride     PRN Meds:.  Allergies: Not on File  No family history on file.  Social History:  reports that she has never smoked. She has never used smokeless tobacco. She reports that she does not drink alcohol. Her drug history is not on file.  ROS: A complete review of systems was performed.  All systems are negative except for pertinent findings as noted.  Physical Exam:  Vital signs in last 24 hours: Temp:  [98.3 F (36.8 C)] 98.3 F (36.8 C) (05/28 0251) Pulse Rate:  [95-105] 97 (05/28 0545) Resp:  [27-35] 30 (05/28 0545) BP: (94-105)/(60-74) 95/64 (05/28 0545) SpO2:  [90 %-98 %] 92 % (05/28  0545) Weight:  [64.9 kg (143 lb)] 64.9 kg (143 lb) (05/28 0251) General:  Alert and oriented, No acute distress HEENT: Normocephalic, atraumatic Neck: No JVD or lymphadenopathy Cardiovascular: Mild tachycardia. Lungs: Tachypnea. Abdomen: Soft, left lower quadrant tenderness.  No rebound or guarding. Back: Left CVA tenderness Extremities: No edema Neurologic: Grossly intact  Laboratory Data:   Recent Labs  08/11/16 0303  WBC 17.0*  HGB 11.9*  HCT 34.4*  PLT 126*     Recent Labs  08/11/16 0303  NA 128*  K 4.0  CL 98*  GLUCOSE 259*  BUN 59*  CALCIUM 8.5*  CREATININE 2.90*     Results for orders placed or performed during the hospital encounter of 08/11/16 (from the past 24 hour(s))  Lipase, blood     Status: None   Collection Time: 08/11/16  3:03 AM  Result Value Ref Range   Lipase 15 11 - 51 U/L  Comprehensive metabolic panel     Status: Abnormal   Collection Time: 08/11/16  3:03 AM  Result Value Ref Range   Sodium 128 (L) 135 - 145 mmol/L   Potassium 4.0 3.5 - 5.1 mmol/L   Chloride 98 (L) 101 - 111 mmol/L   CO2 16 (L) 22 - 32 mmol/L   Glucose, Bld 259 (H) 65 - 99 mg/dL   BUN 59 (H) 6 - 20 mg/dL   Creatinine, Ser 4.092.90 (H) 0.44 - 1.00 mg/dL   Calcium 8.5 (  L) 8.9 - 10.3 mg/dL   Total Protein 6.6 6.5 - 8.1 g/dL   Albumin 3.0 (L) 3.5 - 5.0 g/dL   AST 40 15 - 41 U/L   ALT 39 14 - 54 U/L   Alkaline Phosphatase 87 38 - 126 U/L   Total Bilirubin 1.2 0.3 - 1.2 mg/dL   GFR calc non Af Amer 17 (L) >60 mL/min   GFR calc Af Amer 20 (L) >60 mL/min   Anion gap 14 5 - 15  CBC     Status: Abnormal   Collection Time: 08/11/16  3:03 AM  Result Value Ref Range   WBC 17.0 (H) 4.0 - 10.5 K/uL   RBC 3.73 (L) 3.87 - 5.11 MIL/uL   Hemoglobin 11.9 (L) 12.0 - 15.0 g/dL   HCT 29.5 (L) 62.1 - 30.8 %   MCV 92.2 78.0 - 100.0 fL   MCH 31.9 26.0 - 34.0 pg   MCHC 34.6 30.0 - 36.0 g/dL   RDW 65.7 84.6 - 96.2 %   Platelets 126 (L) 150 - 400 K/uL  I-Stat CG4 Lactic Acid, ED      Status: None   Collection Time: 08/11/16  4:27 AM  Result Value Ref Range   Lactic Acid, Venous 1.51 0.5 - 1.9 mmol/L  Urinalysis, Routine w reflex microscopic     Status: Abnormal   Collection Time: 08/11/16  4:30 AM  Result Value Ref Range   Color, Urine YELLOW YELLOW   APPearance CLOUDY (A) CLEAR   Specific Gravity, Urine 1.016 1.005 - 1.030   pH 5.0 5.0 - 8.0   Glucose, UA NEGATIVE NEGATIVE mg/dL   Hgb urine dipstick LARGE (A) NEGATIVE   Bilirubin Urine NEGATIVE NEGATIVE   Ketones, ur NEGATIVE NEGATIVE mg/dL   Protein, ur 952 (A) NEGATIVE mg/dL   Nitrite NEGATIVE NEGATIVE   Leukocytes, UA LARGE (A) NEGATIVE   RBC / HPF TOO NUMEROUS TO COUNT 0 - 5 RBC/hpf   WBC, UA TOO NUMEROUS TO COUNT 0 - 5 WBC/hpf   Bacteria, UA FEW (A) NONE SEEN   Squamous Epithelial / LPF NONE SEEN NONE SEEN   No results found for this or any previous visit (from the past 240 hour(s)).  Renal Function:  Recent Labs  08/11/16 0303  CREATININE 2.90*   Estimated Creatinine Clearance: 17.7 mL/min (A) (by C-G formula based on SCr of 2.9 mg/dL (H)).  Radiologic Imaging: Ct Abdomen Pelvis Wo Contrast  Result Date: 08/11/2016 CLINICAL DATA:  Acute onset of generalized abdominal pain. Initial encounter. EXAM: CT ABDOMEN AND PELVIS WITHOUT CONTRAST TECHNIQUE: Multidetector CT imaging of the abdomen and pelvis was performed following the standard protocol without IV contrast. COMPARISON:  CT of the abdomen and pelvis performed 12/15/2009 FINDINGS: Lower chest: A trace left pleural effusion is noted, mildly loculated in appearance, with left basilar airspace opacification. Underlying pneumonia cannot be excluded. Diffuse coronary artery calcifications are seen. Hepatobiliary: The liver is unremarkable in appearance. Stones are seen dependently within the gallbladder. The common bile duct remains normal in caliber. Pancreas: The pancreas is within normal limits. Spleen: The spleen is unremarkable in appearance.  Adrenals/Urinary Tract: The adrenal glands are grossly unremarkable in appearance. There is mild left-sided hydronephrosis, with prominence of the left ureter along its entire course. An obstructing large 1.1 x 0.7 cm stone is noted at the distal left ureter, just above the left vesicoureteral junction, with an adjacent smaller 0.5 cm stone seen more proximally. Bilateral perinephric stranding is noted. Nonobstructing bilateral renal stones  measure up to 0.7 cm in size. Stomach/Bowel: The stomach is unremarkable in appearance. The small bowel is within normal limits. The appendix is normal in caliber, without evidence of appendicitis. The colon is unremarkable in appearance. Vascular/Lymphatic: Scattered calcification is seen along the abdominal aorta and its branches. The abdominal aorta is otherwise grossly unremarkable. The inferior vena cava is grossly unremarkable. No retroperitoneal lymphadenopathy is seen. No pelvic sidewall lymphadenopathy is identified. Reproductive: The bladder is decompressed and not well assessed. The uterus is grossly unremarkable in appearance. No suspicious adnexal masses are seen. Other: No additional soft tissue abnormalities are seen. Musculoskeletal: No acute osseous abnormalities are identified. Facet disease is noted at the lower lumbar spine. The visualized musculature is unremarkable in appearance. IMPRESSION: 1. Mild left-sided hydronephrosis, with an obstructing large 1.1 x 0.7 cm stone at the distal left ureter, just above the left vesicoureteral junction. Adjacent smaller 0.5 cm stone seen more proximally. 2. Nonobstructing bilateral renal stones measure up to 0.7 cm in size. 3. Trace left pleural effusion, mildly loculated in appearance, with left basilar airspace opacification. Underlying pneumonia cannot be excluded. 4. Diffuse coronary artery calcifications seen. 5. Scattered aortic atherosclerosis. Electronically Signed   By: Roanna Raider M.D.   On: 08/11/2016 04:57    Dg Chest 2 View  Result Date: 08/11/2016 CLINICAL DATA:  Acute onset of shortness of breath and generalized chest pain. Initial encounter. EXAM: CHEST  2 VIEW COMPARISON:  CTA of the chest performed 12/16/2009 FINDINGS: The lungs are well-aerated. Mild left basilar airspace opacity may reflect atelectasis or possibly mild pneumonia. There is no evidence of pleural effusion or pneumothorax. The heart is borderline normal in size. No acute osseous abnormalities are seen. IMPRESSION: Mild left basilar airspace opacity may reflect atelectasis or possibly mild pneumonia. Electronically Signed   By: Roanna Raider M.D.   On: 08/11/2016 04:43    I independently reviewed the above imaging studies.  Impression/Assessment:  2 left distal ureteral stones with left hydroureteronephrosis, associated UTI.  Plan:  I discussed urgent stent placement with the patient and her husband, who attended the visit.  I discussed the need for urgent decompression of her kidney, admission to the hospital, treatment of her urinary tract infection, with eventual, down the road, management of the stones.  Once her urinary tract infection has adequately resolved.  They understand the procedure as well as risks and complications and are in agreement to proceed.

## 2016-08-11 NOTE — ED Triage Notes (Signed)
Pt presents via EMS for abdominal pain that started 1 hour pta. Pt has had several episodes of vomiting over the past 2 days. Also has diarrhea.

## 2016-08-12 ENCOUNTER — Inpatient Hospital Stay (HOSPITAL_COMMUNITY): Payer: BLUE CROSS/BLUE SHIELD

## 2016-08-12 ENCOUNTER — Encounter (HOSPITAL_COMMUNITY): Payer: Self-pay | Admitting: Urology

## 2016-08-12 DIAGNOSIS — R0602 Shortness of breath: Secondary | ICD-10-CM

## 2016-08-12 LAB — CBC WITH DIFFERENTIAL/PLATELET
BASOS ABS: 0 10*3/uL (ref 0.0–0.1)
Basophils Relative: 0 %
Eosinophils Absolute: 0 10*3/uL (ref 0.0–0.7)
Eosinophils Relative: 0 %
HCT: 35 % — ABNORMAL LOW (ref 36.0–46.0)
Hemoglobin: 11.8 g/dL — ABNORMAL LOW (ref 12.0–15.0)
LYMPHS PCT: 2 %
Lymphs Abs: 0.2 10*3/uL — ABNORMAL LOW (ref 0.7–4.0)
MCH: 31.7 pg (ref 26.0–34.0)
MCHC: 33.7 g/dL (ref 30.0–36.0)
MCV: 94.1 fL (ref 78.0–100.0)
Monocytes Absolute: 0.7 10*3/uL (ref 0.1–1.0)
Monocytes Relative: 5 %
Neutro Abs: 13 10*3/uL — ABNORMAL HIGH (ref 1.7–7.7)
Neutrophils Relative %: 93 %
PLATELETS: 130 10*3/uL — AB (ref 150–400)
RBC: 3.72 MIL/uL — ABNORMAL LOW (ref 3.87–5.11)
RDW: 13.9 % (ref 11.5–15.5)
WBC: 13.9 10*3/uL — ABNORMAL HIGH (ref 4.0–10.5)

## 2016-08-12 LAB — COMPREHENSIVE METABOLIC PANEL
ALT: 27 U/L (ref 14–54)
ANION GAP: 9 (ref 5–15)
AST: 25 U/L (ref 15–41)
Albumin: 2.5 g/dL — ABNORMAL LOW (ref 3.5–5.0)
Alkaline Phosphatase: 73 U/L (ref 38–126)
BUN: 55 mg/dL — ABNORMAL HIGH (ref 6–20)
CHLORIDE: 105 mmol/L (ref 101–111)
CO2: 20 mmol/L — ABNORMAL LOW (ref 22–32)
Calcium: 8.4 mg/dL — ABNORMAL LOW (ref 8.9–10.3)
Creatinine, Ser: 2.21 mg/dL — ABNORMAL HIGH (ref 0.44–1.00)
GFR, EST AFRICAN AMERICAN: 27 mL/min — AB (ref >60)
GFR, EST NON AFRICAN AMERICAN: 24 mL/min — AB (ref >60)
Glucose, Bld: 271 mg/dL — ABNORMAL HIGH (ref 65–99)
Potassium: 4.1 mmol/L (ref 3.5–5.1)
Sodium: 134 mmol/L — ABNORMAL LOW (ref 135–145)
Total Bilirubin: 0.6 mg/dL (ref 0.3–1.2)
Total Protein: 6.2 g/dL — ABNORMAL LOW (ref 6.5–8.1)

## 2016-08-12 LAB — HEMOGLOBIN A1C
HEMOGLOBIN A1C: 8 % — AB (ref 4.8–5.6)
MEAN PLASMA GLUCOSE: 183 mg/dL

## 2016-08-12 LAB — GLUCOSE, CAPILLARY
GLUCOSE-CAPILLARY: 250 mg/dL — AB (ref 65–99)
GLUCOSE-CAPILLARY: 222 mg/dL — AB (ref 65–99)
GLUCOSE-CAPILLARY: 271 mg/dL — AB (ref 65–99)
Glucose-Capillary: 242 mg/dL — ABNORMAL HIGH (ref 65–99)

## 2016-08-12 LAB — HIV ANTIBODY (ROUTINE TESTING W REFLEX): HIV SCREEN 4TH GENERATION: NONREACTIVE

## 2016-08-12 MED ORDER — INSULIN GLARGINE 100 UNIT/ML ~~LOC~~ SOLN
10.0000 [IU] | Freq: Every day | SUBCUTANEOUS | Status: DC
  Administered 2016-08-12: 10 [IU] via SUBCUTANEOUS
  Filled 2016-08-12: qty 0.1

## 2016-08-12 NOTE — Progress Notes (Signed)
Inpatient Diabetes Program Recommendations  AACE/ADA: New Consensus Statement on Inpatient Glycemic Control (2015)  Target Ranges:  Prepandial:   less than 140 mg/dL      Peak postprandial:   less than 180 mg/dL (1-2 hours)      Critically ill patients:  140 - 180 mg/dL   Lab Results  Component Value Date   GLUCAP 222 (H) 08/12/2016   HGBA1C 8.0 (H) 08/11/2016    Review of Glycemic Control Results for Elizabeth Hendricks, Anapaola L (MRN [id number]) as of 08/12/2016 12:55  Ref. Range 08/11/2016 11:47 08/11/2016 17:34 08/11/2016 21:38 08/12/2016 08:01 08/12/2016 11:52  Glucose-Capillary Latest Ref Range: 65 - 99 mg/dL 284326 (H) 132326 (H) 440341 (H) 250 (H) 222 (H)   Diabetes history: DM2 Outpatient Diabetes medications: Metformin 1 gm bid Current orders for Inpatient glycemic control: Novolog correction 0-9 units tid + 0-5 units hs  Inpatient Diabetes Program Recommendations:  Please consider while in the hospital & off Metformin: -Lantus 13 units daily -Increase Novolog correction to moderate 0-15 units tid + 0-5 units hs  Thank you, Darel HongJudy E. Narcissus Detwiler, RN, MSN, CDE  Diabetes Coordinator Inpatient Glycemic Control Team Team Pager [phone] (8am-5pm) 08/12/2016 1:08 PM

## 2016-08-12 NOTE — Progress Notes (Signed)
PROGRESS NOTE    Elizabeth Hendricks  NFA:[id number] DOB: [birth year] DOA: 08/11/2016 PCP: Patient, No Pcp Per    Brief Narrative: Elizabeth Hendricks is a 56 y.o. female with medical history significant of DM and hypothyroidism, HTN, HLD. Presents with abd pain, nausea and vomiting. She was found to have hydronephrosis and ureterolithiasis.  Urology consulted and she underwent stent placement in the left ureter.   Assessment & Plan:   Active Problems:   AKI (acute kidney injury) (HCC)   Hydronephrosis due to obstruction of ureter   Complicated UTI (urinary tract infection)   Nausea vomiting and diarrhea   Hyponatremia   Diabetes mellitus with complication (HCC)   CAD (coronary artery disease)   Hypothyroidism   Acute kidney injury: possibly from hydronephrosis and renal stones.  Improving with hydration and stent placement.  Monitor renal parameters in am.    UTI:  Cultures grew e coli, sensitivities are pending.  Resume rocephin.    Hyponatremia:  Improved with hydration.   Ureterolithiasis:  Urology consulted and double J stent placed in the left ureter.  Further management as per urology.   Diabetes mellitus>  CBG (last 3)   Recent Labs  08/12/16 0801 08/12/16 1152 08/12/16 1714  GLUCAP 250* 222* 271*    Resume SSI.  hgba1c is 8.  Started her onlantus 10 units.    Hypertension; Controlled.   Sob:  ? Fluid overload.  Ordered CXR.  Hold fluids temporarily.   Hyperlipidemia:  Resume statin.     DVT prophylaxis: Heparin/) Code Status: full code.  Family Communication: none at bedside.  Disposition Plan: pending further eval.    Consultants:   urology  Procedures: Cystoscopy, left retrograde pyelogram, fluoroscopic interpretation, placement of double-J stent and left ureter (24 centimeter by 6 French contour without tether)     Antimicrobials: rocephin.    Subjective: Wants to know when she can go home.  Also sob.   Objective: Vitals:   08/11/16 1831 08/11/16 2142 08/12/16 0545 08/12/16 1310  BP: 121/83 103/62 (!) 142/90 102/66  Pulse: 87 89 83 85  Resp: 19 18 20 18   Temp: 98.2 F (36.8 C) 97.9 F (36.6 C) 97.8 F (36.6 C) 98.5 F (36.9 C)  TempSrc: Oral Oral Oral Oral  SpO2: 96% 96% 98% 96%  Weight:      Height:        Intake/Output Summary (Last 24 hours) at 08/12/16 1845 Last data filed at 08/12/16 1719  Gross per 24 hour  Intake          1962.08 ml  Output             1850 ml  Net           112.08 ml   Filed Weights   08/11/16 0251  Weight: 64.9 kg (143 lb)    Examination:  General exam: Appears calm and comfortable  Respiratory system: Clear to auscultation. Respiratory effort normal. Cardiovascular system: S1 & S2 heard, RRR. No JVD, murmurs, rubs, gallops or clicks. No pedal edema. Gastrointestinal system: Abdomen is nondistended, soft and nontender. No organomegaly or masses felt. Normal bowel sounds heard. Central nervous system: Alert and oriented. No focal neurological deficits. Extremities: Symmetric 5 x 5 power. Skin: No rashes, lesions or ulcers Psychiatry: Judgement and insight appear normal. Mood & affect appropriate.     Data Reviewed: I have personally reviewed following labs and imaging studies  CBC:  Recent Labs Lab 08/11/16 0303 08/12/16 0531  WBC 17.0* 13.9*  NEUTROABS  --  13.0*  HGB 11.9* 11.8*  HCT 34.4* 35.0*  MCV 92.2 94.1  PLT 126* 130*   Basic Metabolic Panel:  Recent Labs Lab 08/11/16 0303 08/11/16 1440 08/12/16 0531  NA 128* 130* 134*  K 4.0 4.4 4.1  CL 98* 103 105  CO2 16* 16* 20*  GLUCOSE 259* 357* 271*  BUN 59* 58* 55*  CREATININE 2.90* 2.55* 2.21*  CALCIUM 8.5* 7.7* 8.4*   GFR: Estimated Creatinine Clearance: 23.3 mL/min (A) (by C-G formula based on SCr of 2.21 mg/dL (H)). Liver Function Tests:  Recent Labs Lab 08/11/16 0303 08/12/16 0531  AST 40 25  ALT 39 27  ALKPHOS 87 73  BILITOT 1.2 0.6  PROT 6.6 6.2*  ALBUMIN 3.0* 2.5*     Recent Labs Lab 08/11/16 0303  LIPASE 15   No results for input(s): AMMONIA in the last 168 hours. Coagulation Profile: No results for input(s): INR, PROTIME in the last 168 hours. Cardiac Enzymes: No results for input(s): CKTOTAL, CKMB, CKMBINDEX, TROPONINI in the last 168 hours. BNP (last 3 results) No results for input(s): PROBNP in the last 8760 hours. HbA1C:  Recent Labs  08/11/16 0902  HGBA1C 8.0*   CBG:  Recent Labs Lab 08/11/16 1734 08/11/16 2138 08/12/16 0801 08/12/16 1152 08/12/16 1714  GLUCAP 326* 341* 250* 222* 271*   Lipid Profile: No results for input(s): CHOL, HDL, LDLCALC, TRIG, CHOLHDL, LDLDIRECT in the last 72 hours. Thyroid Function Tests: No results for input(s): TSH, T4TOTAL, FREET4, T3FREE, THYROIDAB in the last 72 hours. Anemia Panel: No results for input(s): VITAMINB12, FOLATE, FERRITIN, TIBC, IRON, RETICCTPCT in the last 72 hours. Sepsis Labs:  Recent Labs Lab 08/11/16 0427  LATICACIDVEN 1.51    Recent Results (from the past 240 hour(s))  Urine culture     Status: Abnormal (Preliminary result)   Collection Time: 08/11/16  4:30 AM  Result Value Ref Range Status   Specimen Description URINE, RANDOM  Final   Special Requests ADDED [id number] [phone]  Final   Culture (A)  Final    >=100,000 COLONIES/mL ESCHERICHIA COLI SUSCEPTIBILITIES TO FOLLOW    Report Status PENDING  Incomplete         Radiology Studies: Ct Abdomen Pelvis Wo Contrast  Result Date: 08/11/2016 CLINICAL DATA:  Acute onset of generalized abdominal pain. Initial encounter. EXAM: CT ABDOMEN AND PELVIS WITHOUT CONTRAST TECHNIQUE: Multidetector CT imaging of the abdomen and pelvis was performed following the standard protocol without IV contrast. COMPARISON:  CT of the abdomen and pelvis performed 12/15/2009 FINDINGS: Lower chest: A trace left pleural effusion is noted, mildly loculated in appearance, with left basilar airspace opacification. Underlying pneumonia cannot  be excluded. Diffuse coronary artery calcifications are seen. Hepatobiliary: The liver is unremarkable in appearance. Stones are seen dependently within the gallbladder. The common bile duct remains normal in caliber. Pancreas: The pancreas is within normal limits. Spleen: The spleen is unremarkable in appearance. Adrenals/Urinary Tract: The adrenal glands are grossly unremarkable in appearance. There is mild left-sided hydronephrosis, with prominence of the left ureter along its entire course. An obstructing large 1.1 x 0.7 cm stone is noted at the distal left ureter, just above the left vesicoureteral junction, with an adjacent smaller 0.5 cm stone seen more proximally. Bilateral perinephric stranding is noted. Nonobstructing bilateral renal stones measure up to 0.7 cm in size. Stomach/Bowel: The stomach is unremarkable in appearance. The small bowel is within normal limits. The appendix is normal in caliber, without evidence of appendicitis. The colon  is unremarkable in appearance. Vascular/Lymphatic: Scattered calcification is seen along the abdominal aorta and its branches. The abdominal aorta is otherwise grossly unremarkable. The inferior vena cava is grossly unremarkable. No retroperitoneal lymphadenopathy is seen. No pelvic sidewall lymphadenopathy is identified. Reproductive: The bladder is decompressed and not well assessed. The uterus is grossly unremarkable in appearance. No suspicious adnexal masses are seen. Other: No additional soft tissue abnormalities are seen. Musculoskeletal: No acute osseous abnormalities are identified. Facet disease is noted at the lower lumbar spine. The visualized musculature is unremarkable in appearance. IMPRESSION: 1. Mild left-sided hydronephrosis, with an obstructing large 1.1 x 0.7 cm stone at the distal left ureter, just above the left vesicoureteral junction. Adjacent smaller 0.5 cm stone seen more proximally. 2. Nonobstructing bilateral renal stones measure up to  0.7 cm in size. 3. Trace left pleural effusion, mildly loculated in appearance, with left basilar airspace opacification. Underlying pneumonia cannot be excluded. 4. Diffuse coronary artery calcifications seen. 5. Scattered aortic atherosclerosis. Electronically Signed   By: Roanna Raider M.D.   On: 08/11/2016 04:57   Dg Chest 2 View  Result Date: 08/11/2016 CLINICAL DATA:  Acute onset of shortness of breath and generalized chest pain. Initial encounter. EXAM: CHEST  2 VIEW COMPARISON:  CTA of the chest performed 12/16/2009 FINDINGS: The lungs are well-aerated. Mild left basilar airspace opacity may reflect atelectasis or possibly mild pneumonia. There is no evidence of pleural effusion or pneumothorax. The heart is borderline normal in size. No acute osseous abnormalities are seen. IMPRESSION: Mild left basilar airspace opacity may reflect atelectasis or possibly mild pneumonia. Electronically Signed   By: Roanna Raider M.D.   On: 08/11/2016 04:43   Dg C-arm 1-60 Min-no Report  Result Date: 08/11/2016 Fluoroscopy was utilized by the requesting physician.  No radiographic interpretation.        Scheduled Meds: . aspirin EC  81 mg Oral Daily  . atenolol  50 mg Oral BID  . famotidine  20 mg Oral Daily  . heparin  5,000 Units Subcutaneous Q8H  . losartan  100 mg Oral Daily   And  . hydrochlorothiazide  12.5 mg Oral Daily  . insulin aspart  0-5 Units Subcutaneous QHS  . insulin aspart  0-9 Units Subcutaneous TID WC  . insulin glargine  10 Units Subcutaneous QHS  . levothyroxine  125 mcg Oral QAC breakfast  . simvastatin  20 mg Oral q1800  . sodium bicarbonate  50 mEq Intravenous Once  . sodium chloride flush  3 mL Intravenous Q12H   Continuous Infusions: . sodium chloride    . cefTRIAXone (ROCEPHIN)  IV Stopped (08/12/16 0603)     LOS: 1 day    Time spent: 35 minutes.     Kathlen Mody, MD [facility] Pager [phone]   If 7PM-7AM, please contact  night-coverage [email or web address] Password [email or web address] 08/12/2016, 6:45 PM

## 2016-08-13 ENCOUNTER — Inpatient Hospital Stay (HOSPITAL_COMMUNITY): Payer: BLUE CROSS/BLUE SHIELD

## 2016-08-13 DIAGNOSIS — N3001 Acute cystitis with hematuria: Secondary | ICD-10-CM

## 2016-08-13 DIAGNOSIS — E871 Hypo-osmolality and hyponatremia: Secondary | ICD-10-CM

## 2016-08-13 DIAGNOSIS — N179 Acute kidney failure, unspecified: Secondary | ICD-10-CM

## 2016-08-13 DIAGNOSIS — N132 Hydronephrosis with renal and ureteral calculous obstruction: Principal | ICD-10-CM

## 2016-08-13 DIAGNOSIS — E118 Type 2 diabetes mellitus with unspecified complications: Secondary | ICD-10-CM

## 2016-08-13 DIAGNOSIS — N390 Urinary tract infection, site not specified: Secondary | ICD-10-CM

## 2016-08-13 DIAGNOSIS — E039 Hypothyroidism, unspecified: Secondary | ICD-10-CM

## 2016-08-13 LAB — CBC WITH DIFFERENTIAL/PLATELET
BASOS ABS: 0 10*3/uL (ref 0.0–0.1)
BASOS PCT: 0 %
EOS ABS: 0 10*3/uL (ref 0.0–0.7)
EOS PCT: 0 %
HCT: 35.6 % — ABNORMAL LOW (ref 36.0–46.0)
Hemoglobin: 12 g/dL (ref 12.0–15.0)
Lymphocytes Relative: 4 %
Lymphs Abs: 0.5 10*3/uL — ABNORMAL LOW (ref 0.7–4.0)
MCH: 32.1 pg (ref 26.0–34.0)
MCHC: 33.7 g/dL (ref 30.0–36.0)
MCV: 95.2 fL (ref 78.0–100.0)
MONO ABS: 1.3 10*3/uL — AB (ref 0.1–1.0)
Monocytes Relative: 9 %
Neutro Abs: 12.7 10*3/uL — ABNORMAL HIGH (ref 1.7–7.7)
Neutrophils Relative %: 87 %
PLATELETS: 129 10*3/uL — AB (ref 150–400)
RBC: 3.74 MIL/uL — ABNORMAL LOW (ref 3.87–5.11)
RDW: 14 % (ref 11.5–15.5)
WBC: 14.6 10*3/uL — ABNORMAL HIGH (ref 4.0–10.5)

## 2016-08-13 LAB — BASIC METABOLIC PANEL
ANION GAP: 10 (ref 5–15)
BUN: 42 mg/dL — ABNORMAL HIGH (ref 6–20)
CALCIUM: 8.7 mg/dL — AB (ref 8.9–10.3)
CO2: 19 mmol/L — ABNORMAL LOW (ref 22–32)
Chloride: 105 mmol/L (ref 101–111)
Creatinine, Ser: 1.81 mg/dL — ABNORMAL HIGH (ref 0.44–1.00)
GFR, EST AFRICAN AMERICAN: 35 mL/min — AB (ref >60)
GFR, EST NON AFRICAN AMERICAN: 30 mL/min — AB (ref >60)
GLUCOSE: 210 mg/dL — AB (ref 65–99)
Potassium: 4.1 mmol/L (ref 3.5–5.1)
SODIUM: 134 mmol/L — AB (ref 135–145)

## 2016-08-13 LAB — URINE CULTURE

## 2016-08-13 LAB — GLUCOSE, CAPILLARY
GLUCOSE-CAPILLARY: 266 mg/dL — AB (ref 65–99)
Glucose-Capillary: 271 mg/dL — ABNORMAL HIGH (ref 65–99)
Glucose-Capillary: 224 mg/dL — ABNORMAL HIGH (ref 65–99)
Glucose-Capillary: 231 mg/dL — ABNORMAL HIGH (ref 65–99)

## 2016-08-13 MED ORDER — PROCHLORPERAZINE EDISYLATE 5 MG/ML IJ SOLN
10.0000 mg | Freq: Once | INTRAMUSCULAR | Status: AC
  Administered 2016-08-13: 10 mg via INTRAVENOUS
  Filled 2016-08-13: qty 2

## 2016-08-13 MED ORDER — SODIUM CHLORIDE 0.9 % IV SOLN
INTRAVENOUS | Status: DC
  Administered 2016-08-13 – 2016-08-14 (×2): via INTRAVENOUS

## 2016-08-13 MED ORDER — INSULIN GLARGINE 100 UNIT/ML ~~LOC~~ SOLN
14.0000 [IU] | Freq: Every day | SUBCUTANEOUS | Status: DC
  Administered 2016-08-13 – 2016-08-15 (×3): 14 [IU] via SUBCUTANEOUS
  Filled 2016-08-13 (×4): qty 0.14

## 2016-08-13 MED ORDER — AMLODIPINE BESYLATE 5 MG PO TABS
5.0000 mg | ORAL_TABLET | Freq: Every day | ORAL | Status: DC
  Administered 2016-08-14 – 2016-08-18 (×5): 5 mg via ORAL
  Filled 2016-08-13 (×5): qty 1

## 2016-08-13 MED ORDER — OXYCODONE-ACETAMINOPHEN 5-325 MG PO TABS: 1.0000 | ORAL_TABLET | ORAL | Status: DC | PRN

## 2016-08-13 MED ORDER — OXYCODONE HCL 5 MG PO TABS
5.0000 mg | ORAL_TABLET | ORAL | Status: DC | PRN
  Filled 2016-08-13: qty 1

## 2016-08-13 NOTE — Care Management Note (Signed)
Case Management Note  Patient Details  Name: Elizabeth Hendricks MRN: [id number] Date of Birth: 20-Aug-1960  Subjective/Objective:                    Action/Plan:  No needs identified at present , will continue to follow Expected Discharge Date:                  Expected Discharge Plan:  Home/Self Care  In-House Referral:     Discharge planning Services     Post Acute Care Choice:    Choice offered to:     DME Arranged:    DME Agency:     HH Arranged:    HH Agency:     Status of Service:  In process, will continue to follow  If discussed at Long Length of Stay Meetings, dates discussed:    Additional Comments:  Kingsley PlanWile, Clydene Burack Marie, RN 08/13/2016, 11:58 AM

## 2016-08-13 NOTE — Progress Notes (Addendum)
Dr Janee Mornhompson called back and RN informed him of patient's fall. Patient states that her pain is from her left shoulder to left elbow. Dr Janee Mornhompson stated he would place orders for XR of patient's arm. RN notified charge nurse of information.  RN offered to call patient's husband and notify him of patient's unwitnessed fall. Patient stated, "no, please don't. Let him sleep. I'll tell him."

## 2016-08-13 NOTE — Progress Notes (Signed)
RN notified by nurse tech that he walked in and found patient in the floor by the bathroom. Patient stated she she had started to pee while walking from the bed to the bathroom and she slid in the urine. Patient stated that she did not hit her head but she did hit her arm on the ground when she fell and she rates pain as a 6 on a scale of 0/10. Patient complains of no other pain. Patient assisted back to standing by nurse tech and assisted to bathroom by nurse tech to finish urinating then nurse tech and RN assisted patient back to bed. Neuro assessment performed by RN and it was at patient's baseline. Patient alert and oriented x4. RN paged MD to notify him of this. Patient's bed alarm on and patient verbalized understanding of reinforcement by RN that she needed to call nursing staff for assistance to go to the bathroom.

## 2016-08-13 NOTE — Progress Notes (Addendum)
PROGRESS NOTE    Elizabeth Hendricks  ZOX:[id number] DOB: Oct 26, 1960 DOA: 08/11/2016 PCP: Patient, No Pcp Per    Brief Narrative:  Amara L Terenziis a 56 y.o.femalewith medical history significant of DM and hypothyroidism, HTN, HLD. Presents with abd pain, nausea and vomiting. She was found to have hydronephrosis and ureterolithiasis.  Urology consulted and she underwent cystoscopy with stent placement in the left ureter.     Assessment & Plan:   Active Problems:   AKI (acute kidney injury) (HCC)   Hydronephrosis due to obstruction of ureter   Complicated UTI (urinary tract infection)   Nausea vomiting and diarrhea   Hyponatremia   Diabetes mellitus with complication (HCC)   CAD (coronary artery disease)   Hypothyroidism  #1 acute renal failure Likely secondary to post renal azotemia from hydronephrosis and renal stones as noted per CT abdomen and pelvis which had a mild left-sided hydronephrosis with an obstructing large 1.1 x 0.7 cm stone at the distal left ureter just above left vesicourethral junction. Last creatinine noted on Epic from 12/15/2009 was 1.27. Patient status post cystoscopy with placement of double-J stent and left ureter per Dr.Dahtstedt. Creatinine was as high as 2.9 and trending down and currently at 1.81. Patient with good urine output of 1200 mL over the past 24 hours. Continue gentle hydration. Discontinue ARB and HCTZ. Follow.  #2 2 left distal urethral stones with left hydro-ureteronephrosis As noted on CT abdomen and pelvis. Patient status post cystoscopy with placement of double-J stent and left ureter per urology. Renal function trending down. Continue empiric IV Rocephin for UTI. Per urology.  #3 complicated Escherichia coli urinary tract infection Urine cultures with greater than 100,000 Escherichia coli. Continue IV Rocephin. As renal function improves and patient's fever curve trended down to transition to oral antibiotics and treat for total of 14  days.  #4 hypothyroidism Check a TSH. Continue home dose Synthroid.  #5 hyperlipidemia Continue statin.  #6 diabetes mellitus Hemoglobin A1c 8.0. CBGs ranging from 224-271. Increase Lantus to 14 units daily. Continue sliding scale insulin. Monitor blood glucose levels as renal function improves. Follow.  #7 hyponatremia Improved with hydration.  #8 hypertension Stable. Discontinue ARB and HCTZ. Continue home regimen atenolol. Add Norvasc 5 mg daily and titrate as needed.  #9 Fall  Patient noted to have a mechanical fall this afternoon.Patient with complaints of left upper extremity pain. Will check plain films of left forearm and left humerus.    DVT prophylaxis: Heparin Code Status: Full Family Communication: Updated patient and husband at bedside. Disposition Plan: Likely home once renal function has improved and close to baseline and per urology.   Consultants:   Urology: Dr.Dahlstedt 08/11/2016    Procedures:   CT abdomen and pelvis 08/11/2016  Chest x-ray 08/11/2016, 08/12/2016  Plain films of the left humerus and left forearm pending 08/13/2016 Cystoscopy, left retrograde pyelogram, fluoroscopic interpretation, placement of double-J stent and left ureter (24 centimeter by 6 French contour without tether)  --Per Dr Retta Diones 08/11/2016   Antimicrobials:   IV Rocephin 08/11/2016   Subjective: Patient laying in bed with complaints of headache, and nausea. Patient denies any chest pain. No shortness of breath. Patient asking when she can go home. Patient states has been urinating a lot. Was called by nursing that patient had a mechanical fall as she slipped while trying to go to the bathroom.  Objective: Vitals:   08/12/16 0545 08/12/16 1310 08/12/16 2151 08/13/16 0413  BP: (!) 142/90 102/66 119/74 (!) 165/78  Pulse: 83 85 92 89  Resp: 20 18 17  (!) 23  Temp: 97.8 F (36.6 C) 98.5 F (36.9 C) (!) 100.4 F (38 C) 99.8 F (37.7 C)  TempSrc: Oral Oral Oral  Oral  SpO2: 98% 96% 95% 93%  Weight:      Height:        Intake/Output Summary (Last 24 hours) at 08/13/16 1413 Last data filed at 08/13/16 0600  Gross per 24 hour  Intake              940 ml  Output              850 ml  Net               90 ml   Filed Weights   08/11/16 0251  Weight: 64.9 kg (143 lb)    Examination:  General exam: Appears calm and comfortable  Respiratory system: Clear to auscultation. Respiratory effort normal. Cardiovascular system: S1 & S2 heard, RRR. No JVD, murmurs, rubs, gallops or clicks. No pedal edema. Gastrointestinal system: Abdomen is nondistended, soft and nontender. No organomegaly or masses felt. Normal bowel sounds heard. Central nervous system: Alert and oriented. No focal neurological deficits. Extremities: Symmetric 5 x 5 power. Skin: No rashes, lesions or ulcers Psychiatry: Judgement and insight appear normal. Mood & affect appropriate.     Data Reviewed: I have personally reviewed following labs and imaging studies  CBC:  Recent Labs Lab 08/11/16 0303 08/12/16 0531 08/13/16 0412  WBC 17.0* 13.9* 14.6*  NEUTROABS  --  13.0* 12.7*  HGB 11.9* 11.8* 12.0  HCT 34.4* 35.0* 35.6*  MCV 92.2 94.1 95.2  PLT 126* 130* 129*   Basic Metabolic Panel:  Recent Labs Lab 08/11/16 0303 08/11/16 1440 08/12/16 0531 08/13/16 0412  NA 128* 130* 134* 134*  K 4.0 4.4 4.1 4.1  CL 98* 103 105 105  CO2 16* 16* 20* 19*  GLUCOSE 259* 357* 271* 210*  BUN 59* 58* 55* 42*  CREATININE 2.90* 2.55* 2.21* 1.81*  CALCIUM 8.5* 7.7* 8.4* 8.7*   GFR: Estimated Creatinine Clearance: 28.4 mL/min (A) (by C-G formula based on SCr of 1.81 mg/dL (H)). Liver Function Tests:  Recent Labs Lab 08/11/16 0303 08/12/16 0531  AST 40 25  ALT 39 27  ALKPHOS 87 73  BILITOT 1.2 0.6  PROT 6.6 6.2*  ALBUMIN 3.0* 2.5*    Recent Labs Lab 08/11/16 0303  LIPASE 15   No results for input(s): AMMONIA in the last 168 hours. Coagulation Profile: No results for  input(s): INR, PROTIME in the last 168 hours. Cardiac Enzymes: No results for input(s): CKTOTAL, CKMB, CKMBINDEX, TROPONINI in the last 168 hours. BNP (last 3 results) No results for input(s): PROBNP in the last 8760 hours. HbA1C:  Recent Labs  08/11/16 0902  HGBA1C 8.0*   CBG:  Recent Labs Lab 08/12/16 1152 08/12/16 1714 08/12/16 2153 08/13/16 0719 08/13/16 1202  GLUCAP 222* 271* 242* 271* 224*   Lipid Profile: No results for input(s): CHOL, HDL, LDLCALC, TRIG, CHOLHDL, LDLDIRECT in the last 72 hours. Thyroid Function Tests: No results for input(s): TSH, T4TOTAL, FREET4, T3FREE, THYROIDAB in the last 72 hours. Anemia Panel: No results for input(s): VITAMINB12, FOLATE, FERRITIN, TIBC, IRON, RETICCTPCT in the last 72 hours. Sepsis Labs:  Recent Labs Lab 08/11/16 0427  LATICACIDVEN 1.51    Recent Results (from the past 240 hour(s))  Urine culture     Status: Abnormal   Collection Time: 08/11/16  4:30 AM  Result Value Ref Range Status   Specimen Description URINE, RANDOM  Final   Special Requests ADDED [id number]  Final   Culture >=100,000 COLONIES/mL ESCHERICHIA COLI (A)  Final   Report Status 08/13/2016 FINAL  Final   Organism ID, Bacteria ESCHERICHIA COLI (A)  Final      Susceptibility   Escherichia coli - MIC*    AMPICILLIN >=32 RESISTANT Resistant     CEFAZOLIN <=4 SENSITIVE Sensitive     CEFTRIAXONE <=1 SENSITIVE Sensitive     CIPROFLOXACIN <=0.25 SENSITIVE Sensitive     GENTAMICIN <=1 SENSITIVE Sensitive     IMIPENEM <=0.25 SENSITIVE Sensitive     NITROFURANTOIN <=16 SENSITIVE Sensitive     TRIMETH/SULFA <=20 SENSITIVE Sensitive     AMPICILLIN/SULBACTAM 16 INTERMEDIATE Intermediate     PIP/TAZO <=4 SENSITIVE Sensitive     Extended ESBL NEGATIVE Sensitive     * >=100,000 COLONIES/mL ESCHERICHIA COLI         Radiology Studies: Dg Chest 2 View  Result Date: 08/12/2016 CLINICAL DATA:  Acute onset of shortness of breath. Initial encounter. EXAM:  CHEST  2 VIEW COMPARISON:  Chest radiograph performed 08/11/2016 FINDINGS: A small left pleural effusion is noted. Minimal retrocardiac opacity could reflect mild pneumonia, improved from the prior study. No pneumothorax is seen. The heart is normal in size. No acute osseous abnormalities identified. IMPRESSION: Small left pleural effusion noted. Minimal retrocardiac opacity could reflect mild pneumonia, improved from the prior study. Electronically Signed   By: Roanna Raider M.D.   On: 08/12/2016 22:22        Scheduled Meds: . [START ON 08/14/2016] amLODipine  5 mg Oral Daily  . aspirin EC  81 mg Oral Daily  . atenolol  50 mg Oral BID  . famotidine  20 mg Oral Daily  . heparin  5,000 Units Subcutaneous Q8H  . insulin aspart  0-5 Units Subcutaneous QHS  . insulin aspart  0-9 Units Subcutaneous TID WC  . insulin glargine  14 Units Subcutaneous QHS  . levothyroxine  125 mcg Oral QAC breakfast  . prochlorperazine  10 mg Intravenous Once  . simvastatin  20 mg Oral q1800  . sodium bicarbonate  50 mEq Intravenous Once  . sodium chloride flush  3 mL Intravenous Q12H   Continuous Infusions: . sodium chloride 75 mL/hr at 08/13/16 1026  . cefTRIAXone (ROCEPHIN)  IV Stopped (08/13/16 0551)     LOS: 2 days    Time spent: 35 minutes    Giavanna Kang, MD [facility] Pager [phone]  If 7PM-7AM, please contact night-coverage [email or web address] Password [email or web address] 08/13/2016, 2:13 PM

## 2016-08-14 ENCOUNTER — Inpatient Hospital Stay (HOSPITAL_COMMUNITY): Payer: BLUE CROSS/BLUE SHIELD

## 2016-08-14 DIAGNOSIS — R112 Nausea with vomiting, unspecified: Secondary | ICD-10-CM

## 2016-08-14 DIAGNOSIS — N10 Acute tubulo-interstitial nephritis: Secondary | ICD-10-CM

## 2016-08-14 DIAGNOSIS — R197 Diarrhea, unspecified: Secondary | ICD-10-CM

## 2016-08-14 DIAGNOSIS — R509 Fever, unspecified: Secondary | ICD-10-CM

## 2016-08-14 LAB — GLUCOSE, CAPILLARY
GLUCOSE-CAPILLARY: 186 mg/dL — AB (ref 65–99)
Glucose-Capillary: 171 mg/dL — ABNORMAL HIGH (ref 65–99)
Glucose-Capillary: 155 mg/dL — ABNORMAL HIGH (ref 65–99)
Glucose-Capillary: 262 mg/dL — ABNORMAL HIGH (ref 65–99)
Glucose-Capillary: 213 mg/dL — ABNORMAL HIGH (ref 65–99)

## 2016-08-14 LAB — BASIC METABOLIC PANEL
ANION GAP: 9 (ref 5–15)
BUN: 33 mg/dL — ABNORMAL HIGH (ref 6–20)
CALCIUM: 8.1 mg/dL — AB (ref 8.9–10.3)
CHLORIDE: 105 mmol/L (ref 101–111)
CO2: 20 mmol/L — AB (ref 22–32)
Creatinine, Ser: 1.36 mg/dL — ABNORMAL HIGH (ref 0.44–1.00)
GFR calc Af Amer: 49 mL/min — ABNORMAL LOW (ref >60)
GFR calc non Af Amer: 43 mL/min — ABNORMAL LOW (ref >60)
GLUCOSE: 182 mg/dL — AB (ref 65–99)
Potassium: 3.6 mmol/L (ref 3.5–5.1)
Sodium: 134 mmol/L — ABNORMAL LOW (ref 135–145)

## 2016-08-14 LAB — CBC WITH DIFFERENTIAL/PLATELET
BASOS ABS: 0 10*3/uL (ref 0.0–0.1)
Basophils Relative: 0 %
Eosinophils Absolute: 0 10*3/uL (ref 0.0–0.7)
Eosinophils Relative: 0 %
HEMATOCRIT: 31.2 % — AB (ref 36.0–46.0)
HEMOGLOBIN: 10.5 g/dL — AB (ref 12.0–15.0)
Lymphocytes Relative: 7 %
Lymphs Abs: 0.7 10*3/uL (ref 0.7–4.0)
MCH: 31.6 pg (ref 26.0–34.0)
MCHC: 33.7 g/dL (ref 30.0–36.0)
MCV: 94 fL (ref 78.0–100.0)
MONO ABS: 1.6 10*3/uL — AB (ref 0.1–1.0)
Monocytes Relative: 16 %
NEUTROS ABS: 8.2 10*3/uL — AB (ref 1.7–7.7)
Neutrophils Relative %: 78 %
Platelets: 98 10*3/uL — ABNORMAL LOW (ref 150–400)
RBC: 3.32 MIL/uL — ABNORMAL LOW (ref 3.87–5.11)
RDW: 13.6 % (ref 11.5–15.5)
WBC: 10.5 10*3/uL (ref 4.0–10.5)

## 2016-08-14 MED ORDER — AZITHROMYCIN 500 MG IV SOLR
500.0000 mg | INTRAVENOUS | Status: DC
  Administered 2016-08-14: 500 mg via INTRAVENOUS
  Filled 2016-08-14 (×2): qty 500

## 2016-08-14 MED ORDER — OXYBUTYNIN CHLORIDE 5 MG PO TABS
5.0000 mg | ORAL_TABLET | Freq: Three times a day (TID) | ORAL | Status: DC | PRN
  Administered 2016-08-15: 5 mg via ORAL
  Filled 2016-08-14: qty 1

## 2016-08-14 NOTE — Progress Notes (Signed)
PROGRESS NOTE    Elizabeth Hendricks  RUE:[id number] DOB: 22-Jan-1961 DOA: 08/11/2016 PCP: Patient, No Pcp Per    Brief Narrative:  Elizabeth L Terenziis a 56 y.o.femalewith medical history significant of DM and hypothyroidism, HTN, HLD. Presents with abd pain, nausea and vomiting. She was found to have hydronephrosis and ureterolithiasis.  Urology consulted and she underwent cystoscopy with stent placement in the left ureter.     Assessment & Plan:   Principal Problem:   Acute renal failure (HCC) Active Problems:   Hydronephrosis due to obstruction of ureter   Complicated UTI (urinary tract infection)   Acute pyelonephritis   AKI (acute kidney injury) (HCC)   Nausea vomiting and diarrhea   Hyponatremia   Diabetes mellitus with complication (HCC)   CAD (coronary artery disease)   Hypothyroidism   Acute cystitis with hematuria   Fever  #1 acute renal failure Likely secondary to post renal azotemia from hydronephrosis and renal stones as noted per CT abdomen and pelvis which had a mild left-sided hydronephrosis with an obstructing large 1.1 x 0.7 cm stone at the distal left ureter just above left vesicourethral junction. Last creatinine noted on Epic from 12/15/2009 was 1.27. Patient status post cystoscopy with placement of double-J stent and left ureter per Dr.Dahtstedt. Creatinine was as high as 2.9 and trending down and currently at 1. 36. Patient with good urine output of 1100 mL over the past 24 hours. Saline lock IV fluids. Discontinue ARB and HCTZ. Will discontinue Foley catheter. Follow.  #2 2 left distal urethral stones with left hydro-ureteronephrosis As noted on CT abdomen and pelvis. Patient status post cystoscopy with placement of double-J stent and left ureter per urology. Renal function trending down. Continue empiric IV Rocephin for UTI. Per urology.  #3 complicated Escherichia coli urinary tract infection/acute pyelonephritis/fever Urine cultures with greater than 100,000  Escherichia coli. CT abdomen and pelvis done on admission with perinephric stranding. Continue IV Rocephin. Patient noted to spike fevers of 102.9 last night and 102.4 earlier this morning. Patient with no significant respiratory symptoms. Will repeat CT abdomen and pelvis to rule out abscess formation and evaluation of recently placed ureteral stent. Will ask urology to reassess the patient. As renal function improves and patient's fever curve trends down will transition to oral antibiotics and treat for total of 14 days. Chest x-ray done on 08/12/2016 with minimal retrocardiac opacity which could reflect mild pneumonia improved from prior study. Will add azithromycin 5 days.  #4 hypothyroidism Check a TSH. Continue home dose Synthroid.  #5 hyperlipidemia Continue statin.  #6 diabetes mellitus Hemoglobin A1c 8.0. CBGs ranging from 155-186. Increased Lantus to 14 units daily. Continue sliding scale insulin. Monitor blood glucose levels as renal function improves. Follow.  #7 hyponatremia Improved with hydration. Saline lock IV fluids.  #8 hypertension Stable. Discontinued ARB and HCTZ. Continue home regimen atenolol and Norvasc.  #9 Fall  Patient noted to have a mechanical fall this afternoon.Patient states improvement with left upper extremity pain. Plain films of left upper extremity negative for any acute fractures.   DVT prophylaxis: Heparin Code Status: Full Family Communication: Updated patient. No family at bedside.  Disposition Plan: Likely home once renal function has improved and afebrile 24-48 hours and renal function close to baseline and per urology.   Consultants:   Urology: Dr.Dahlstedt 08/11/2016    Procedures:   CT abdomen and pelvis 08/11/2016  Chest x-ray 08/11/2016, 08/12/2016  Plain films of the left humerus and left forearm 08/13/2016 Cystoscopy, left retrograde  pyelogram, fluoroscopic interpretation, placement of double-J stent and left ureter (24  centimeter by 6 French contour without tether)  --Per Dr Retta Diones 08/11/2016 CT abdomen and pelvis pending 08/14/2016   Antimicrobials:   IV Rocephin 08/11/2016  IV azithromycin 08/14/2016   Subjective: Patient sitting up in chair. Denies any chest pain. No shortness of breath. Patient with good urine output. Patient noted to spike fevers of 102.9 yesterday evening and 102.4 early this morning. Patient states left upper extremity pain improved.  Objective: Vitals:   08/13/16 2043 08/14/16 0141 08/14/16 0244 08/14/16 0454  BP: 127/82 (!) 159/96  122/79  Pulse: 86 92  79  Resp: 20 (!) 22  20  Temp: 98.8 F (37.1 C) (!) 102.4 F (39.1 C) 100 F (37.8 C) 97.9 F (36.6 C)  TempSrc: Oral Oral  Oral  SpO2: 95% 95%  97%  Weight:      Height:        Intake/Output Summary (Last 24 hours) at 08/14/16 1150 Last data filed at 08/14/16 0903  Gross per 24 hour  Intake          1851.25 ml  Output             1100 ml  Net           751.25 ml   Filed Weights   08/11/16 0251  Weight: 64.9 kg (143 lb)    Examination:  General exam: Appears calm and comfortable  Respiratory system: Clear to auscultation. Respiratory effort normal. Cardiovascular system: S1 & S2 heard, RRR. No JVD, murmurs, rubs, gallops or clicks. No pedal edema. Gastrointestinal system: Abdomen is nondistended, soft and nontender. No organomegaly or masses felt. Normal bowel sounds heard. Central nervous system: Alert and oriented. No focal neurological deficits. Extremities: Symmetric 5 x 5 power. Skin: No rashes, lesions or ulcers Psychiatry: Judgement and insight appear normal. Mood & affect appropriate.     Data Reviewed: I have personally reviewed following labs and imaging studies  CBC:  Recent Labs Lab 08/11/16 0303 08/12/16 0531 08/13/16 0412 08/14/16 0426  WBC 17.0* 13.9* 14.6* 10.5  NEUTROABS  --  13.0* 12.7* 8.2*  HGB 11.9* 11.8* 12.0 10.5*  HCT 34.4* 35.0* 35.6* 31.2*  MCV 92.2 94.1  95.2 94.0  PLT 126* 130* 129* 98*   Basic Metabolic Panel:  Recent Labs Lab 08/11/16 0303 08/11/16 1440 08/12/16 0531 08/13/16 0412 08/14/16 0426  NA 128* 130* 134* 134* 134*  K 4.0 4.4 4.1 4.1 3.6  CL 98* 103 105 105 105  CO2 16* 16* 20* 19* 20*  GLUCOSE 259* 357* 271* 210* 182*  BUN 59* 58* 55* 42* 33*  CREATININE 2.90* 2.55* 2.21* 1.81* 1.36*  CALCIUM 8.5* 7.7* 8.4* 8.7* 8.1*   GFR: Estimated Creatinine Clearance: 37.8 mL/min (A) (by C-G formula based on SCr of 1.36 mg/dL (H)). Liver Function Tests:  Recent Labs Lab 08/11/16 0303 08/12/16 0531  AST 40 25  ALT 39 27  ALKPHOS 87 73  BILITOT 1.2 0.6  PROT 6.6 6.2*  ALBUMIN 3.0* 2.5*    Recent Labs Lab 08/11/16 0303  LIPASE 15   No results for input(s): AMMONIA in the last 168 hours. Coagulation Profile: No results for input(s): INR, PROTIME in the last 168 hours. Cardiac Enzymes: No results for input(s): CKTOTAL, CKMB, CKMBINDEX, TROPONINI in the last 168 hours. BNP (last 3 results) No results for input(s): PROBNP in the last 8760 hours. HbA1C: No results for input(s): HGBA1C in the last 72 hours. CBG:  Recent Labs Lab 08/13/16 1202 08/13/16 1712 08/13/16 2141 08/14/16 0136 08/14/16 0753  GLUCAP 224* 231* 266* 171* 155*   Lipid Profile: No results for input(s): CHOL, HDL, LDLCALC, TRIG, CHOLHDL, LDLDIRECT in the last 72 hours. Thyroid Function Tests: No results for input(s): TSH, T4TOTAL, FREET4, T3FREE, THYROIDAB in the last 72 hours. Anemia Panel: No results for input(s): VITAMINB12, FOLATE, FERRITIN, TIBC, IRON, RETICCTPCT in the last 72 hours. Sepsis Labs:  Recent Labs Lab 08/11/16 0427  LATICACIDVEN 1.51    Recent Results (from the past 240 hour(s))  Urine culture     Status: Abnormal   Collection Time: 08/11/16  4:30 AM  Result Value Ref Range Status   Specimen Description URINE, RANDOM  Final   Special Requests ADDED [id number]  Final   Culture >=100,000 COLONIES/mL ESCHERICHIA  COLI (A)  Final   Report Status 08/13/2016 FINAL  Final   Organism ID, Bacteria ESCHERICHIA COLI (A)  Final      Susceptibility   Escherichia coli - MIC*    AMPICILLIN >=32 RESISTANT Resistant     CEFAZOLIN <=4 SENSITIVE Sensitive     CEFTRIAXONE <=1 SENSITIVE Sensitive     CIPROFLOXACIN <=0.25 SENSITIVE Sensitive     GENTAMICIN <=1 SENSITIVE Sensitive     IMIPENEM <=0.25 SENSITIVE Sensitive     NITROFURANTOIN <=16 SENSITIVE Sensitive     TRIMETH/SULFA <=20 SENSITIVE Sensitive     AMPICILLIN/SULBACTAM 16 INTERMEDIATE Intermediate     PIP/TAZO <=4 SENSITIVE Sensitive     Extended ESBL NEGATIVE Sensitive     * >=100,000 COLONIES/mL ESCHERICHIA COLI         Radiology Studies: Dg Chest 2 View  Result Date: 08/12/2016 CLINICAL DATA:  Acute onset of shortness of breath. Initial encounter. EXAM: CHEST  2 VIEW COMPARISON:  Chest radiograph performed 08/11/2016 FINDINGS: A small left pleural effusion is noted. Minimal retrocardiac opacity could reflect mild pneumonia, improved from the prior study. No pneumothorax is seen. The heart is normal in size. No acute osseous abnormalities identified. IMPRESSION: Small left pleural effusion noted. Minimal retrocardiac opacity could reflect mild pneumonia, improved from the prior study. Electronically Signed   By: Roanna Raider M.D.   On: 08/12/2016 22:22   Dg Forearm Left  Result Date: 08/13/2016 CLINICAL DATA:  Status post fall in bathroom, with proximal left forearm pain. Initial encounter. EXAM: LEFT FOREARM - 2 VIEW COMPARISON:  None. FINDINGS: Deformity at the proximal radius and ulna appears to be chronic in nature, reflecting remote injury. No definite elbow joint effusion is seen. There is mild chronic deformity about the distal radius and ulna. A peripheral IV catheter is noted overlying the antecubital fossa. No definite soft tissue abnormalities are characterized on radiograph. The carpal rows appear grossly intact. IMPRESSION: 1. No  definite evidence of acute fracture or dislocation. 2. Apparent chronic deformity noted at the proximal radius and ulna, and also at the distal radius and ulna, reflecting remote injury. Electronically Signed   By: Roanna Raider M.D.   On: 08/13/2016 22:17   Dg Humerus Left  Result Date: 08/13/2016 CLINICAL DATA:  Status post fall in bathroom, with left distal arm pain. Initial encounter. EXAM: LEFT HUMERUS - 2+ VIEW COMPARISON:  None. FINDINGS: There is no evidence of fracture or dislocation. There is chronic deformity of the distal humerus, and proximal radius and ulna. No definite elbow joint effusion is seen. The left humeral head remains seated at the glenoid fossa. Calcification overlying the left humeral head may reflect calcific tendinitis.  The left acromioclavicular joint is unremarkable. No definite soft abnormalities are characterized on radiograph. IMPRESSION: 1. No evidence of fracture or dislocation. 2. Chronic deformity of the distal humerus, and proximal radius and ulna. 3. Calcification overlying the left humeral head may reflect calcific tendinitis. Electronically Signed   By: Roanna RaiderJeffery  Chang M.D.   On: 08/13/2016 22:19        Scheduled Meds: . amLODipine  5 mg Oral Daily  . aspirin EC  81 mg Oral Daily  . atenolol  50 mg Oral BID  . famotidine  20 mg Oral Daily  . heparin  5,000 Units Subcutaneous Q8H  . insulin aspart  0-5 Units Subcutaneous QHS  . insulin aspart  0-9 Units Subcutaneous TID WC  . insulin glargine  14 Units Subcutaneous QHS  . levothyroxine  125 mcg Oral QAC breakfast  . simvastatin  20 mg Oral q1800  . sodium bicarbonate  50 mEq Intravenous Once  . sodium chloride flush  3 mL Intravenous Q12H   Continuous Infusions: . azithromycin 500 mg (08/14/16 1036)  . cefTRIAXone (ROCEPHIN)  IV Stopped (08/14/16 0601)     LOS: 3 days    Time spent: 40 minutes    Etana Beets, MD [facility] Pager [phone]  If 7PM-7AM, please contact  night-coverage [email or web address] Password [email or web address] 08/14/2016, 11:50 AM

## 2016-08-14 NOTE — Progress Notes (Signed)
Paged K.Schorr regarding patinet's temp of 102.4 at 0141. Temp now 100.0

## 2016-08-14 NOTE — Evaluation (Signed)
Physical Therapy Evaluation Patient Details Name: Elizabeth Hendricks MRN: [id number] DOB: [birth year] Today's Date: 08/14/2016   History of Present Illness  Pt is a 56 y/o female admitted secondary to L abdominal pain, nausea and vomitting. Pt found to have kidney stones/infection, ureterolithiasis now s/p stent placement on 5/28. PMH including but not limited to DM, HLD, HTN. Of note, pt fell in bathroom during this admission on 5/30, all x-rays were negative for any fxs.  Clinical Impression  Pt presented supine in bed with HOB elevated, awake and willing to participate in therapy session. Prior to admission, pt reported that she was independent with all functional mobility, ADLs and continues to work. Pt ambulated in hallways with min guard to min A secondary to LOB x2. PT will continue to follow acutely to ensure a safe d/c home.    Follow Up Recommendations No PT follow up;Supervision/Assistance - 24 hour (initially)    Equipment Recommendations  None recommended by PT    Recommendations for Other Services       Precautions / Restrictions Precautions Precautions: Fall Precaution Comments: fell once since sx due to slipped in her own urine while going to bathroom Restrictions Weight Bearing Restrictions: No      Mobility  Bed Mobility Overal bed mobility: Modified Independent                Transfers Overall transfer level: Needs assistance Equipment used: None Transfers: Sit to/from Stand Sit to Stand: Supervision         General transfer comment: supervision for safety  Ambulation/Gait Ambulation/Gait assistance: Min guard;Min assist Ambulation Distance (Feet): 200 Feet Assistive device: None Gait Pattern/deviations: Step-through pattern;Decreased step length - right;Decreased step length - left;Decreased stride length;Drifts right/left Gait velocity: decreased Gait velocity interpretation: Below normal speed for age/gender General Gait Details: modest  instability with ambulation and with LOB x2 requiring min A to maintain upright position  Stairs            Wheelchair Mobility    Modified Rankin (Stroke Patients Only)       Balance Overall balance assessment: Needs assistance;History of Falls Sitting-balance support: No upper extremity supported;Feet supported Sitting balance-Leahy Scale: Good     Standing balance support: No upper extremity supported Standing balance-Leahy Scale: Fair Standing balance comment: static balance is fair, dynamic is poor                             Pertinent Vitals/Pain Pain Assessment: No/denies pain Faces Pain Scale: Hurts even more Pain Location: LUE where IV is Pain Descriptors / Indicators: Grimacing;Guarding Pain Intervention(s): Limited activity within patient's tolerance;Monitored during session;Repositioned (made RN aware)    Home Living Family/patient expects to be discharged to:: Private residence Living Arrangements: Spouse/significant other Available Help at Discharge: Family;Available PRN/intermittently Type of Home: House Home Access: Stairs to enter Entrance Stairs-Rails: Right;Left;Can reach both Entrance Stairs-Number of Steps: 6 Home Layout: Two level Home Equipment: Shower seat Additional Comments: husband works. Pt also works as a Psychologist, occupationalsubstitute teacher and retail sales at [employer]    Prior Function Level of Independence: Independent               Higher education careers adviserHand Dominance   Dominant Hand: Right    Extremity/Trunk Assessment   Upper Extremity Assessment Upper Extremity Assessment: Defer to OT evaluation LUE Deficits / Details: painful due to IV; xrays negative for acute fx from fall while here    Lower Extremity Assessment Lower  Extremity Assessment: Overall WFL for tasks assessed       Communication   Communication: No difficulties  Cognition Arousal/Alertness: Awake/alert Behavior During Therapy: Flat affect Overall Cognitive Status:  Impaired/Different from baseline Area of Impairment: Problem solving;Safety/judgement;Memory;Following commands                     Memory: Decreased short-term memory Following Commands: Follows one step commands with increased time Safety/Judgement: Decreased awareness of safety;Decreased awareness of deficits   Problem Solving: Slow processing General Comments: family oriented to time, place, situation. Pt slow to answer all questions      General Comments      Exercises     Assessment/Plan    PT Assessment Patient needs continued PT services  PT Problem List Decreased balance;Decreased mobility;Decreased coordination;Decreased cognition;Decreased knowledge of use of DME;Decreased safety awareness       PT Treatment Interventions DME instruction;Gait training;Stair training;Functional mobility training;Therapeutic activities;Therapeutic exercise;Balance training;Neuromuscular re-education;Patient/family education    PT Goals (Current goals can be found in the Care Plan section)  Acute Rehab PT Goals Patient Stated Goal: home soon and back to work PT Goal Formulation: With patient Time For Goal Achievement: 08/28/16 Potential to Achieve Goals: Good    Frequency Min 3X/week   Barriers to discharge        Co-evaluation               AM-PAC PT "6 Clicks" Daily Activity  Outcome Measure Difficulty turning over in bed (including adjusting bedclothes, sheets and blankets)?: None Difficulty moving from lying on back to sitting on the side of the bed? : None Difficulty sitting down on and standing up from a chair with arms (e.g., wheelchair, bedside commode, etc,.)?: None Help needed moving to and from a bed to chair (including a wheelchair)?: A Little Help needed walking in hospital room?: A Little Help needed climbing 3-5 steps with a railing? : A Little 6 Click Score: 21    End of Session Equipment Utilized During Treatment: Gait belt Activity  Tolerance: Patient tolerated treatment well Patient left: in bed;with call bell/phone within reach;with bed alarm set Nurse Communication: Mobility status PT Visit Diagnosis: Other abnormalities of gait and mobility (R26.89)    Time: 4098-1191 PT Time Calculation (min) (ACUTE ONLY): 15 min   Charges:   PT Evaluation $PT Eval Moderate Complexity: 1 Procedure     PT G Codes:        Deborah Chalk, PT, DPT [phone]   Elizabeth Hendricks 08/14/2016, 1:47 PM

## 2016-08-14 NOTE — Progress Notes (Signed)
Spoke with Dr. Judie GrieveLomboy (Urology) to clarify foley placement order. Instructed to place foley before CT scan due to fever and possible urinary retention.

## 2016-08-14 NOTE — Progress Notes (Signed)
Follow-up Consult Note   Subjective: Febrile up to 102.67F last 24 hours. Last febrile at 1AM. Urology called earlier today to alert of fevers, CT scan recommended. As of note time, CT has not yet been done. Foley was also later recommended, however has also not yet been placed due to nursing concerns and report of patient/family refusal.   Objective   Vital signs in last 24 hours: BP (!) 145/90 (BP Location: Right Arm)   Pulse 86   Temp 99 F (37.2 C) (Oral)   Resp 19   Ht 4\' 11"  (1.499 m)   Wt 64.9 kg (143 lb)   SpO2 98%   BMI 28.88 kg/m   Intake/Output last 3 shifts: I/O last 3 completed shifts: In: 2291.3 [P.O.:810; I.V.:1431.3; IV Piggyback:50] Out: 1100 [Urine:1100]  Physical Exam General: NAD, A&O, resting, appropriate HEENT: [place]/AT, EOMI, MMM Pulmonary: Normal work of breathing on RA Cardiovascular: Regular rate & rhythm, HDS, adequate peripheral perfusion Abdomen: soft, NTTP, nondistended GU: No CVA tenderness Extremities: warm and well perfused, no edema Neuro: Appropriate, no focal neurological deficits  Most Recent Labs: Lab Results  Component Value Date   WBC 10.5 08/14/2016   HGB 10.5 (L) 08/14/2016   HCT 31.2 (L) 08/14/2016   PLT 98 (L) 08/14/2016    Lab Results  Component Value Date   NA 134 (L) 08/14/2016   K 3.6 08/14/2016   CL 105 08/14/2016   CO2 20 (L) 08/14/2016   BUN 33 (H) 08/14/2016   CREATININE 1.36 (H) 08/14/2016   CALCIUM 8.1 (L) 08/14/2016    Lab Results  Component Value Date   ALKPHOS 73 08/12/2016   BILITOT 0.6 08/12/2016   PROT 6.2 (L) 08/12/2016   ALBUMIN 2.5 (L) 08/12/2016   ALT 27 08/12/2016   AST 25 08/12/2016    IMAGING: Dg Chest 2 View  Result Date: 08/12/2016 CLINICAL DATA:  Acute onset of shortness of breath. Initial encounter. EXAM: CHEST  2 VIEW COMPARISON:  Chest radiograph performed 08/11/2016 FINDINGS: A small left pleural effusion is noted. Minimal retrocardiac opacity could reflect mild pneumonia,  improved from the prior study. No pneumothorax is seen. The heart is normal in size. No acute osseous abnormalities identified. IMPRESSION: Small left pleural effusion noted. Minimal retrocardiac opacity could reflect mild pneumonia, improved from the prior study. Electronically Signed   By: Roanna RaiderJeffery  Chang M.D.   On: 08/12/2016 22:22   Dg Forearm Left  Result Date: 08/13/2016 CLINICAL DATA:  Status post fall in bathroom, with proximal left forearm pain. Initial encounter. EXAM: LEFT FOREARM - 2 VIEW COMPARISON:  None. FINDINGS: Deformity at the proximal radius and ulna appears to be chronic in nature, reflecting remote injury. No definite elbow joint effusion is seen. There is mild chronic deformity about the distal radius and ulna. A peripheral IV catheter is noted overlying the antecubital fossa. No definite soft tissue abnormalities are characterized on radiograph. The carpal rows appear grossly intact. IMPRESSION: 1. No definite evidence of acute fracture or dislocation. 2. Apparent chronic deformity noted at the proximal radius and ulna, and also at the distal radius and ulna, reflecting remote injury. Electronically Signed   By: Roanna RaiderJeffery  Chang M.D.   On: 08/13/2016 22:17   Dg Humerus Left  Result Date: 08/13/2016 CLINICAL DATA:  Status post fall in bathroom, with left distal arm pain. Initial encounter. EXAM: LEFT HUMERUS - 2+ VIEW COMPARISON:  None. FINDINGS: There is no evidence of fracture or dislocation. There is chronic deformity of the distal humerus,  and proximal radius and ulna. No definite elbow joint effusion is seen. The left humeral head remains seated at the glenoid fossa. Calcification overlying the left humeral head may reflect calcific tendinitis. The left acromioclavicular joint is unremarkable. No definite soft abnormalities are characterized on radiograph. IMPRESSION: 1. No evidence of fracture or dislocation. 2. Chronic deformity of the distal humerus, and proximal radius and ulna.  3. Calcification overlying the left humeral head may reflect calcific tendinitis. Electronically Signed   By: Roanna Raider M.D.   On: 08/13/2016 22:19    Assessment: Patient is a 56 y.o. female previously seen in consultation, with an infected left distal ureteral stone. She underwent decompression with ureteral stent on 08/11/16 by Dr. Retta Diones.   New recurrent fevers were called to our attention by the primary team and we are seeing her today in follow-up with additional recommendations for this.   Recommendations: 1. Foley catheter placement for maximal urinary drainage in setting of fevers even after adequate antibiotics and stent placement. Additionally well help with imaging as below given refluxing stent in place. 2. CT abdomen/pelvis wo contrast to eval for stent failure, residual hydro. Abscess less likely, ok with holding off on IV contrast.  3. If CT shows no hydronephrosis and stent in good position, recommend ongoing antibiotics and foley catheter.  4. Foley should stay in place until patient has been afebrile and hemodynamically stable for at least 24 hours.  5. If has new fevers, obtain new blood/urine cultures.  ADDENDUM: CT reviewed. No hydronephrosis. Stent in good position. Foley in place with decompressed bladder. Discussed results with patient over phone. All questions answered. Plan as outlined above. Will Rx oxybutynin q8 hr prn bladder spasms for foley discomfort.  Dr. Retta Diones was immediately available.  Thank you for this consult. Please do not hesitate to contact us with any further questions/concerns.  Buck Mam, MD PGY4 Urology Resident  I have reviewed Jason's assessment and plan and I agree with all of the above.

## 2016-08-14 NOTE — Progress Notes (Signed)
Noted order for foley catheter placement before going to CT scan. Message sent to Dr. Janee Mornhompson inquiring indication for foley catheter, awaiting response.

## 2016-08-14 NOTE — Evaluation (Signed)
Occupational Therapy Evaluation and Discharge Patient Details Name: Smith Mincemy L Malkiewicz MRN: [id number] DOB: Sep 11, 1960 Today's Date: 08/14/2016    History of Present Illness Patient with a history of DM, thyroid disease presents with 3 days of vomiting (2-3 episodes per day) and diarrhea (2-3 episodes per day), abdominal pain, and decreased urine. Found to have obstructed, infected left distal ureteral stone and now s/p Cystoscopy, left retrograde pyelogram and placement of double-J stent in left ureter    Clinical Impression   This 56 yo female admitted and underwent above presents to acute OT at a S level (feel she will progress quickly to an independent level without need for any further OT. We will D/C her from OT.    Follow Up Recommendations  No OT follow up;Supervision - Intermittent    Equipment Recommendations  None recommended by OT       Precautions / Restrictions Precautions Precautions: Fall Precaution Comments: fell once since sx due to slipped in her own urine while going to bathroom Restrictions Weight Bearing Restrictions: No      Mobility Bed Mobility Overal bed mobility: Modified Independent                Transfers Overall transfer level: Needs assistance Equipment used: None Transfers: Sit to/from Stand Sit to Stand: Supervision         General transfer comment: Pt ambulated 100 feet with me    Balance Overall balance assessment: Needs assistance;History of Falls (pt reports she fell once while she was home just prior to admission) Sitting-balance support: No upper extremity supported;Feet supported Sitting balance-Leahy Scale: Good     Standing balance support: No upper extremity supported Standing balance-Leahy Scale: Fair                             ADL either performed or assessed with clinical judgement   ADL                                         General ADL Comments: Overall at a S level, feel she will  progress to Independent quickly once she is up on her feet more     Vision Patient Visual Report: No change from baseline              Pertinent Vitals/Pain Pain Assessment: Faces Faces Pain Scale: Hurts even more Pain Location: LUE where IV is Pain Descriptors / Indicators: Grimacing;Guarding Pain Intervention(s): Limited activity within patient's tolerance;Monitored during session;Repositioned (made RN aware)     Hand Dominance Right   Extremity/Trunk Assessment Upper Extremity Assessment Upper Extremity Assessment: LUE deficits/detail LUE Deficits / Details: painful due to IV; xrays negative for acute fx from fall while here           Communication Communication Communication:  (slow to answer questions)   Cognition Arousal/Alertness: Awake/alert Behavior During Therapy: Flat affect Overall Cognitive Status: No family/caregiver present to determine baseline cognitive functioning                                 General Comments: family oriented to time, place, situation. Pt slow to answer all questions              Home Living Family/patient expects to be discharged to:: Private residence Living Arrangements: Spouse/significant  other Available Help at Discharge: Family;Available PRN/intermittently Type of Home: House Home Access: Stairs to enter [employer] of Steps: 6 Entrance Stairs-Rails: Right;Left;Can reach both Home Layout: Two level Alternate Level Stairs-Number of Steps: flight Alternate Level Stairs-Rails: Right;Left Bathroom Shower/Tub: Tub/shower unit;Curtain   Bathroom Toilet: Standard     Home Equipment: Shower seat   Additional Comments: husband works. Pt also works as a Psychologist, occupational at [employer]      Prior Functioning/Environment Level of Independence: Independent                 OT Problem List: Impaired balance (sitting and/or standing);Pain;Obesity      OT  Treatment/Interventions:      OT Goals(Current goals can be found in the care plan section) Acute Rehab OT Goals Patient Stated Goal: home soon and back to work  OT Frequency:                AM-PAC PT "6 Clicks" Daily Activity     Outcome Measure Help from another person eating meals?: None Help from another person taking care of personal grooming?: None Help from another person toileting, which includes using toliet, bedpan, or urinal?: A Little Help from another person bathing (including washing, rinsing, drying)?: A Little Help from another person to put on and taking off regular upper body clothing?: None Help from another person to put on and taking off regular lower body clothing?: A Little 6 Click Score: 21   End of Session Equipment Utilized During Treatment: Gait belt Nurse Communication: Mobility status (pt with LUE pain where IV is)  Activity Tolerance: Patient tolerated treatment well Patient left: in chair;with call bell/phone within reach;with chair alarm set  OT Visit Diagnosis: Unsteadiness on feet (R26.81);Pain Pain - Right/Left: Left Pain - part of body: Arm (where IV is)                Time: 4098-1191 OT Time Calculation (min): 22 min Charges:  OT General Charges $OT Visit: 1 Procedure OT Evaluation $OT Eval Moderate Complexity: 1 Procedure Ignacia Palma, OTR/L [phone] 08/14/2016

## 2016-08-15 LAB — URINALYSIS, MICROSCOPIC (REFLEX): Squamous Epithelial / LPF: NONE SEEN

## 2016-08-15 LAB — CBC WITH DIFFERENTIAL/PLATELET
BASOS ABS: 0 10*3/uL (ref 0.0–0.1)
Basophils Relative: 0 %
EOS ABS: 0.1 10*3/uL (ref 0.0–0.7)
Eosinophils Relative: 1 %
HCT: 32.3 % — ABNORMAL LOW (ref 36.0–46.0)
HEMOGLOBIN: 10.9 g/dL — AB (ref 12.0–15.0)
LYMPHS ABS: 0.8 10*3/uL (ref 0.7–4.0)
LYMPHS PCT: 7 %
MCH: 31.1 pg (ref 26.0–34.0)
MCHC: 33.7 g/dL (ref 30.0–36.0)
MCV: 92 fL (ref 78.0–100.0)
Monocytes Absolute: 1.6 10*3/uL — ABNORMAL HIGH (ref 0.1–1.0)
Monocytes Relative: 14 %
NEUTROS ABS: 8.9 10*3/uL — AB (ref 1.7–7.7)
Neutrophils Relative %: 78 %
Platelets: 122 10*3/uL — ABNORMAL LOW (ref 150–400)
RBC: 3.51 MIL/uL — AB (ref 3.87–5.11)
RDW: 13.4 % (ref 11.5–15.5)
WBC: 11.4 10*3/uL — AB (ref 4.0–10.5)

## 2016-08-15 LAB — URINALYSIS, ROUTINE W REFLEX MICROSCOPIC
BILIRUBIN URINE: NEGATIVE
Glucose, UA: NEGATIVE mg/dL
Ketones, ur: NEGATIVE mg/dL
NITRITE: NEGATIVE
PH: 6 (ref 5.0–8.0)
Protein, ur: 100 mg/dL — AB
SPECIFIC GRAVITY, URINE: 1.025 (ref 1.005–1.030)

## 2016-08-15 LAB — GLUCOSE, CAPILLARY
GLUCOSE-CAPILLARY: 161 mg/dL — AB (ref 65–99)
GLUCOSE-CAPILLARY: 289 mg/dL — AB (ref 65–99)
Glucose-Capillary: 156 mg/dL — ABNORMAL HIGH (ref 65–99)
Glucose-Capillary: 238 mg/dL — ABNORMAL HIGH (ref 65–99)

## 2016-08-15 LAB — BASIC METABOLIC PANEL
Anion gap: 9 (ref 5–15)
BUN: 27 mg/dL — ABNORMAL HIGH (ref 6–20)
CO2: 23 mmol/L (ref 22–32)
CREATININE: 1.25 mg/dL — AB (ref 0.44–1.00)
Calcium: 8.5 mg/dL — ABNORMAL LOW (ref 8.9–10.3)
Chloride: 103 mmol/L (ref 101–111)
GFR calc non Af Amer: 47 mL/min — ABNORMAL LOW (ref >60)
GFR, EST AFRICAN AMERICAN: 55 mL/min — AB (ref >60)
Glucose, Bld: 168 mg/dL — ABNORMAL HIGH (ref 65–99)
Potassium: 3.5 mmol/L (ref 3.5–5.1)
SODIUM: 135 mmol/L (ref 135–145)

## 2016-08-15 LAB — TSH: TSH: 0.925 u[IU]/mL (ref 0.350–4.500)

## 2016-08-15 MED ORDER — AZITHROMYCIN 250 MG PO TABS
500.0000 mg | ORAL_TABLET | Freq: Every day | ORAL | Status: DC
  Administered 2016-08-15 – 2016-08-18 (×4): 500 mg via ORAL
  Filled 2016-08-15 (×4): qty 2

## 2016-08-15 MED ORDER — POTASSIUM CHLORIDE CRYS ER 20 MEQ PO TBCR
40.0000 meq | EXTENDED_RELEASE_TABLET | Freq: Once | ORAL | Status: AC
  Administered 2016-08-15: 40 meq via ORAL
  Filled 2016-08-15: qty 2

## 2016-08-15 NOTE — Progress Notes (Signed)
PROGRESS NOTE    Elizabeth Hendricks  ZOX:[id number] DOB: Dec 16, 1960 DOA: 08/11/2016 PCP: Patient, No Pcp Per    Brief Narrative:  Elizabeth L Terenziis a 56 y.o.femalewith medical history significant of DM and hypothyroidism, HTN, HLD. Presents with abd pain, nausea and vomiting. She was found to have hydronephrosis and ureterolithiasis.  Urology consulted and she underwent cystoscopy with stent placement in the left ureter. Patient noted to be spiking fevers. Repeat CT abdomen and pelvis done. Patient has been pancultured. Results pending.     Assessment & Plan:   Principal Problem:   Acute renal failure (HCC) Active Problems:   Hydronephrosis due to obstruction of ureter   Complicated UTI (urinary tract infection)   Acute pyelonephritis   AKI (acute kidney injury) (HCC)   Nausea vomiting and diarrhea   Hyponatremia   Diabetes mellitus with complication (HCC)   CAD (coronary artery disease)   Hypothyroidism   Acute cystitis with hematuria   Fever  #1 acute renal failure Likely secondary to post renal azotemia from hydronephrosis and renal stones as noted per CT abdomen and pelvis which had a mild left-sided hydronephrosis with an obstructing large 1.1 x 0.7 cm stone at the distal left ureter just above left vesicourethral junction. Last creatinine noted on Epic from 12/15/2009 was 1.27. Patient status post cystoscopy with placement of double-J stent and left ureter per Dr.Dahtstedt. Creatinine was as high as 2.9 and trending down and currently at 1.25. Patient with good urine output of 1000 mL over the past 24 hours. Saline lock IV fluids. Discontinued ARB and HCTZ. Follow.  #2 2 left distal urethral stones with left hydro-ureteronephrosis As noted on CT abdomen and pelvis. Patient status post cystoscopy with placement of double-J stent and left ureter per urology. Renal function trending down. Continue empiric IV Rocephin for UTI. Per urology.  #3 complicated Escherichia coli urinary  tract infection/acute pyelonephritis/fever Urine cultures with greater than 100,000 Escherichia coli. CT abdomen and pelvis done on admission with perinephric stranding. Continue IV Rocephin. Patient noted to spike fevers of 102.9, 102.4, 101.7. Patient with no significant respiratory symptoms. Repeat CT abdomen and pelvis negative for any abscess formation, no hydronephrosis, stent in place. Foley catheter has been ordered per urology. Fever curve seems to be trending down. As renal function improves and patient's fever curve trends down will transition to oral antibiotics and treat for total of 14 days. Chest x-ray done on 08/12/2016 with minimal retrocardiac opacity which could reflect mild pneumonia improved from prior study. Will check blood cultures 2. Repeat UA with cultures and sensitivities. Continue empiric IV Rocephin and azithromycin. Per urology continue Foley catheter until patient is afebrile and hemodynamically stable for at least 24 hours.   #4 hypothyroidism TSH 0.925. Continue home dose Synthroid.  #5 hyperlipidemia Continue statin.  #6 diabetes mellitus Hemoglobin A1c 8.0. CBGs ranging from 156-238. Continue Lantus to 14 units daily. Continue sliding scale insulin. Monitor blood glucose levels as renal function improves. Follow.  #7 hyponatremia Improved with hydration. Saline lock IV fluids.  #8 hypertension Stable. Discontinued ARB and HCTZ. Continue home regimen atenolol and Norvasc.  #9 Fall  Patient noted to have a mechanical fall this afternoon.Patient states improvement with left upper extremity pain. Plain films of left upper extremity negative for any acute fractures.   DVT prophylaxis: Heparin Code Status: Full Family Communication: Updated patient. No family at bedside.  Disposition Plan: Likely home once renal function has improved and afebrile 24-48 hours and renal function close to baseline  and per urology.   Consultants:   Urology: Dr.Dahlstedt  08/11/2016    Procedures:   CT abdomen and pelvis 08/11/2016  Chest x-ray 08/11/2016, 08/12/2016  Plain films of the left humerus and left forearm 08/13/2016 Cystoscopy, left retrograde pyelogram, fluoroscopic interpretation, placement of double-J stent and left ureter (24 centimeter by 6 French contour without tether)  --Per Dr Retta Dionesahlstedt 08/11/2016 CT abdomen and pelvis 08/14/2016   Antimicrobials:   IV Rocephin 08/11/2016  IV azithromycin 08/14/2016   Subjective: Patient laying in bed. No chest and. No shortness of breath. Patient noted to spike a temperature 101.7 overnight.   Objective: Vitals:   08/14/16 2200 08/15/16 0021 08/15/16 0124 08/15/16 0506  BP: (!) 171/96   (!) 147/86  Pulse: 91   79  Resp: (!) 24   20  Temp: (!) 101.7 F (38.7 C) (!) 100.6 F (38.1 C) 99.5 F (37.5 C) 98.8 F (37.1 C)  TempSrc: Oral   Oral  SpO2: 96%   96%  Weight:    67 kg (147 lb 11.2 oz)  Height:        Intake/Output Summary (Last 24 hours) at 08/15/16 1322 Last data filed at 08/15/16 1158  Gross per 24 hour  Intake              860 ml  Output             1775 ml  Net             -915 ml   Filed Weights   08/11/16 0251 08/15/16 0506  Weight: 64.9 kg (143 lb) 67 kg (147 lb 11.2 oz)    Examination:  General exam: Appears calm and comfortable  Respiratory system: Clear to auscultation. Respiratory effort normal. Cardiovascular system: S1 & S2 heard, RRR. No JVD, murmurs, rubs, gallops or clicks. No pedal edema. Gastrointestinal system: Abdomen is nondistended, soft and nontender. No organomegaly or masses felt. Normal bowel sounds heard. Central nervous system: Alert and oriented. No focal neurological deficits. Extremities: Symmetric 5 x 5 power. Skin: No rashes, lesions or ulcers Psychiatry: Judgement and insight appear normal. Mood & affect appropriate.     Data Reviewed: I have personally reviewed following labs and imaging studies  CBC:  Recent Labs Lab  08/11/16 0303 08/12/16 0531 08/13/16 0412 08/14/16 0426 08/15/16 0610  WBC 17.0* 13.9* 14.6* 10.5 11.4*  NEUTROABS  --  13.0* 12.7* 8.2* 8.9*  HGB 11.9* 11.8* 12.0 10.5* 10.9*  HCT 34.4* 35.0* 35.6* 31.2* 32.3*  MCV 92.2 94.1 95.2 94.0 92.0  PLT 126* 130* 129* 98* 122*   Basic Metabolic Panel:  Recent Labs Lab 08/11/16 1440 08/12/16 0531 08/13/16 0412 08/14/16 0426 08/15/16 0610  NA 130* 134* 134* 134* 135  K 4.4 4.1 4.1 3.6 3.5  CL 103 105 105 105 103  CO2 16* 20* 19* 20* 23  GLUCOSE 357* 271* 210* 182* 168*  BUN 58* 55* 42* 33* 27*  CREATININE 2.55* 2.21* 1.81* 1.36* 1.25*  CALCIUM 7.7* 8.4* 8.7* 8.1* 8.5*   GFR: Estimated Creatinine Clearance: 41.8 mL/min (A) (by C-G formula based on SCr of 1.25 mg/dL (H)). Liver Function Tests:  Recent Labs Lab 08/11/16 0303 08/12/16 0531  AST 40 25  ALT 39 27  ALKPHOS 87 73  BILITOT 1.2 0.6  PROT 6.6 6.2*  ALBUMIN 3.0* 2.5*    Recent Labs Lab 08/11/16 0303  LIPASE 15   No results for input(s): AMMONIA in the last 168 hours. Coagulation Profile: No results for  input(s): INR, PROTIME in the last 168 hours. Cardiac Enzymes: No results for input(s): CKTOTAL, CKMB, CKMBINDEX, TROPONINI in the last 168 hours. BNP (last 3 results) No results for input(s): PROBNP in the last 8760 hours. HbA1C: No results for input(s): HGBA1C in the last 72 hours. CBG:  Recent Labs Lab 08/14/16 1204 08/14/16 1714 08/14/16 2209 08/15/16 0741 08/15/16 1243  GLUCAP 186* 262* 213* 156* 161*   Lipid Profile: No results for input(s): CHOL, HDL, LDLCALC, TRIG, CHOLHDL, LDLDIRECT in the last 72 hours. Thyroid Function Tests:  Recent Labs  08/15/16 0610  TSH 0.925   Anemia Panel: No results for input(s): VITAMINB12, FOLATE, FERRITIN, TIBC, IRON, RETICCTPCT in the last 72 hours. Sepsis Labs:  Recent Labs Lab 08/11/16 0427  LATICACIDVEN 1.51    Recent Results (from the past 240 hour(s))  Urine culture     Status: Abnormal     Collection Time: 08/11/16  4:30 AM  Result Value Ref Range Status   Specimen Description URINE, RANDOM  Final   Special Requests ADDED [id number] [phone]  Final   Culture >=100,000 COLONIES/mL ESCHERICHIA COLI (A)  Final   Report Status 08/13/2016 FINAL  Final   Organism ID, Bacteria ESCHERICHIA COLI (A)  Final      Susceptibility   Escherichia coli - MIC*    AMPICILLIN >=32 RESISTANT Resistant     CEFAZOLIN <=4 SENSITIVE Sensitive     CEFTRIAXONE <=1 SENSITIVE Sensitive     CIPROFLOXACIN <=0.25 SENSITIVE Sensitive     GENTAMICIN <=1 SENSITIVE Sensitive     IMIPENEM <=0.25 SENSITIVE Sensitive     NITROFURANTOIN <=16 SENSITIVE Sensitive     TRIMETH/SULFA <=20 SENSITIVE Sensitive     AMPICILLIN/SULBACTAM 16 INTERMEDIATE Intermediate     PIP/TAZO <=4 SENSITIVE Sensitive     Extended ESBL NEGATIVE Sensitive     * >=100,000 COLONIES/mL ESCHERICHIA COLI         Radiology Studies: Ct Abdomen Pelvis Wo Contrast  Result Date: 08/14/2016 CLINICAL DATA:  Recent hydronephrosis status post stent placement fever EXAM: CT ABDOMEN AND PELVIS WITHOUT CONTRAST TECHNIQUE: Multidetector CT imaging of the abdomen and pelvis was performed following the standard protocol without IV contrast. COMPARISON:  08/11/2016 FINDINGS: Lower chest: Small left greater than right pleural effusions. No consolidation. Minimal coronary artery calcification. Normal heart size. Hepatobiliary: No focal hepatic abnormality. Small layering stones in the gallbladder. No biliary dilatation Pancreas: Unremarkable. No pancreatic ductal dilatation or surrounding inflammatory changes. Spleen: Normal in size without focal abnormality. Adrenals/Urinary Tract: Adrenal glands are within normal limits. Moderate left greater than right perinephric fat stranding. Multiple intrarenal calculi on the right similar compared to prior, measuring up to 4 mm in size. Multiple intrarenal calcifications on the left, largest measures 7 mm at the midpole.  Placement of a a left ureteral stent. Minimal left hydronephrosis not significantly worsened. Decreased hydroureter. 11 mm stone along the lateral aspect of distal stent, just proximal to left UVJ. Foley catheter present. Bladder is decompressed. Distal portion of the stent is within the left aspect of the bladder. Stomach/Bowel: Stomach nonenlarged. No dilated small bowel. No colon wall thickening. Normal appendix. Vascular/Lymphatic: Aortic atherosclerosis. No enlarged abdominal or pelvic lymph nodes. Reproductive: No adnexal masses. Other: No free air or free fluid.  Tiny fat in the umbilicus. Musculoskeletal: Degenerative changes. No acute or suspicious bone lesion. IMPRESSION: 1. Interim placement of a a left ureteral stent with minimal residual left hydronephrosis and decreased hydroureter. No gross perinephric fluid collection seen allowing for the absence of  intravenous contrast. Perinephric fat stranding is nonspecific. Residual 11 mm stone adjacent to the distal portion of left ureteral stent, the smaller stone that was previously noted superior to this is not clearly identified. 2. Bilateral intrarenal calculi. 3. Increased small pleural effusions. 4. Gallstones Electronically Signed   By: Jasmine Pang M.D.   On: 08/14/2016 23:34   Dg Forearm Left  Result Date: 08/13/2016 CLINICAL DATA:  Status post fall in bathroom, with proximal left forearm pain. Initial encounter. EXAM: LEFT FOREARM - 2 VIEW COMPARISON:  None. FINDINGS: Deformity at the proximal radius and ulna appears to be chronic in nature, reflecting remote injury. No definite elbow joint effusion is seen. There is mild chronic deformity about the distal radius and ulna. A peripheral IV catheter is noted overlying the antecubital fossa. No definite soft tissue abnormalities are characterized on radiograph. The carpal rows appear grossly intact. IMPRESSION: 1. No definite evidence of acute fracture or dislocation. 2. Apparent chronic deformity  noted at the proximal radius and ulna, and also at the distal radius and ulna, reflecting remote injury. Electronically Signed   By: Roanna Raider M.D.   On: 08/13/2016 22:17   Dg Humerus Left  Result Date: 08/13/2016 CLINICAL DATA:  Status post fall in bathroom, with left distal arm pain. Initial encounter. EXAM: LEFT HUMERUS - 2+ VIEW COMPARISON:  None. FINDINGS: There is no evidence of fracture or dislocation. There is chronic deformity of the distal humerus, and proximal radius and ulna. No definite elbow joint effusion is seen. The left humeral head remains seated at the glenoid fossa. Calcification overlying the left humeral head may reflect calcific tendinitis. The left acromioclavicular joint is unremarkable. No definite soft abnormalities are characterized on radiograph. IMPRESSION: 1. No evidence of fracture or dislocation. 2. Chronic deformity of the distal humerus, and proximal radius and ulna. 3. Calcification overlying the left humeral head may reflect calcific tendinitis. Electronically Signed   By: Roanna Raider M.D.   On: 08/13/2016 22:19        Scheduled Meds: . amLODipine  5 mg Oral Daily  . aspirin EC  81 mg Oral Daily  . atenolol  50 mg Oral BID  . azithromycin  500 mg Oral Daily  . famotidine  20 mg Oral Daily  . heparin  5,000 Units Subcutaneous Q8H  . insulin aspart  0-5 Units Subcutaneous QHS  . insulin aspart  0-9 Units Subcutaneous TID WC  . insulin glargine  14 Units Subcutaneous QHS  . levothyroxine  125 mcg Oral QAC breakfast  . simvastatin  20 mg Oral q1800  . sodium bicarbonate  50 mEq Intravenous Once  . sodium chloride flush  3 mL Intravenous Q12H   Continuous Infusions: . cefTRIAXone (ROCEPHIN)  IV Stopped (08/15/16 0532)     LOS: 4 days    Time spent: 40 minutes    Jenica Costilow, MD [facility] Pager [phone]  If 7PM-7AM, please contact night-coverage [email or web address] Password [email or web address] 08/15/2016, 1:22 PM

## 2016-08-15 NOTE — Progress Notes (Signed)
Physical Therapy Treatment Patient Details Name: Elizabeth Hendricks MRN: [id number] DOB: 28-Feb-1961 Today's Date: 08/15/2016    History of Present Illness Pt is a 56 y/o female admitted secondary to L abdominal pain, nausea and vomitting. Pt found to have kidney stones/infection, ureterolithiasis now s/p stent placement on 5/28. PMH including but not limited to DM, HLD, HTN. Of note, pt fell in bathroom during this admission on 5/30, all x-rays were negative for any fxs.    PT Comments    Pt was able to walk further today (1.5 laps around the unit) with initial staggering gait pattern, but it seemed to improve with increased distance.  Pt is likely weaker and more unsteady due to acute  Illness, and immobility from hospitalization.  She continues to benefit from acute PT, but will likely not need f/u at discharge.  Please do DGI and/or stairs next session.   Follow Up Recommendations  No PT follow up;Supervision/Assistance - 24 hour (initially)     Equipment Recommendations  None recommended by PT    Recommendations for Other Services   NA     Precautions / Restrictions Precautions Precautions: Fall Precaution Comments: fell once since sx due to slipped in her own urine while going to bathroom Restrictions Weight Bearing Restrictions: No    Mobility  Bed Mobility Overal bed mobility: Modified Independent                Transfers Overall transfer level: Needs assistance Equipment used: None Transfers: Sit to/from Stand Sit to Stand: Supervision         General transfer comment: supervision for safety cues to stand for a minute to ensure she was not lightheaded from getting up so quickly.   Ambulation/Gait Ambulation/Gait assistance: Min guard Ambulation Distance (Feet): 710 Feet Assistive device: None Gait Pattern/deviations: Step-through pattern;Staggering left     General Gait Details: pt with mildly staggering gait pattern initally, improved with increased  distance.        Balance Overall balance assessment: Needs assistance;History of Falls Sitting-balance support: No upper extremity supported;Feet supported Sitting balance-Leahy Scale: Good     Standing balance support: No upper extremity supported Standing balance-Leahy Scale: Good Standing balance comment: Pt able to stand and re-arrange her bedside table, bed linens.  Seems to be improving daily.                             Cognition Arousal/Alertness: Awake/alert Behavior During Therapy: Flat affect Overall Cognitive Status: No family/caregiver present to determine baseline cognitive functioning                                 General Comments: Seemed normal for general conversation, not specifically tested             Pertinent Vitals/Pain Pain Assessment: Faces Faces Pain Scale: Hurts little more Pain Location: head Pain Descriptors / Indicators: Aching Pain Intervention(s): Monitored during session;Limited activity within patient's tolerance;Repositioned;RN gave pain meds during session           PT Goals (current goals can now be found in the care plan section) Acute Rehab PT Goals Patient Stated Goal: home soon and back to work    Frequency    Min 3X/week      PT Plan Current plan remains appropriate       AM-PAC PT "6 Clicks" Daily Activity  Outcome Measure  Difficulty turning over in bed (including adjusting bedclothes, sheets and blankets)?: None Difficulty moving from lying on back to sitting on the side of the bed? : None Difficulty sitting down on and standing up from a chair with arms (e.g., wheelchair, bedside commode, etc,.)?: None Help needed moving to and from a bed to chair (including a wheelchair)?: A Little Help needed walking in hospital room?: A Little Help needed climbing 3-5 steps with a railing? : A Little 6 Click Score: 21    End of Session Equipment Utilized During Treatment: Gait belt Activity  Tolerance: Patient tolerated treatment well Patient left: in bed;with call bell/phone within reach;with bed alarm set Nurse Communication: Mobility status PT Visit Diagnosis: Other abnormalities of gait and mobility (R26.89)     Time: [id number] PT Time Calculation (min) (ACUTE ONLY): 22 min  Charges:  $Gait Training: 8-22 mins          Susana Gripp B. Candra Wegner, PT, DPT [phone]            08/15/2016, 3:12 PM

## 2016-08-16 DIAGNOSIS — N132 Hydronephrosis with renal and ureteral calculous obstruction: Secondary | ICD-10-CM

## 2016-08-16 LAB — CBC
HCT: 32.5 % — ABNORMAL LOW (ref 36.0–46.0)
Hemoglobin: 10.7 g/dL — ABNORMAL LOW (ref 12.0–15.0)
MCH: 30.8 pg (ref 26.0–34.0)
MCHC: 32.9 g/dL (ref 30.0–36.0)
MCV: 93.7 fL (ref 78.0–100.0)
PLATELETS: 165 10*3/uL (ref 150–400)
RBC: 3.47 MIL/uL — AB (ref 3.87–5.11)
RDW: 13.3 % (ref 11.5–15.5)
WBC: 11.8 10*3/uL — ABNORMAL HIGH (ref 4.0–10.5)

## 2016-08-16 LAB — BASIC METABOLIC PANEL
Anion gap: 11 (ref 5–15)
BUN: 31 mg/dL — ABNORMAL HIGH (ref 6–20)
CO2: 19 mmol/L — ABNORMAL LOW (ref 22–32)
Calcium: 8.3 mg/dL — ABNORMAL LOW (ref 8.9–10.3)
Chloride: 100 mmol/L — ABNORMAL LOW (ref 101–111)
Creatinine, Ser: 1.27 mg/dL — ABNORMAL HIGH (ref 0.44–1.00)
GFR calc Af Amer: 54 mL/min — ABNORMAL LOW (ref >60)
GFR, EST NON AFRICAN AMERICAN: 46 mL/min — AB (ref >60)
GLUCOSE: 197 mg/dL — AB (ref 65–99)
POTASSIUM: 3.9 mmol/L (ref 3.5–5.1)
Sodium: 130 mmol/L — ABNORMAL LOW (ref 135–145)

## 2016-08-16 LAB — GLUCOSE, CAPILLARY
GLUCOSE-CAPILLARY: 274 mg/dL — AB (ref 65–99)
Glucose-Capillary: 165 mg/dL — ABNORMAL HIGH (ref 65–99)
Glucose-Capillary: 203 mg/dL — ABNORMAL HIGH (ref 65–99)
Glucose-Capillary: 261 mg/dL — ABNORMAL HIGH (ref 65–99)

## 2016-08-16 LAB — URINE CULTURE: Culture: NO GROWTH

## 2016-08-16 MED ORDER — INSULIN GLARGINE 100 UNIT/ML ~~LOC~~ SOLN
18.0000 [IU] | Freq: Every day | SUBCUTANEOUS | Status: DC
Start: 2016-08-16 — End: 2016-08-18
  Administered 2016-08-16 – 2016-08-17 (×2): 18 [IU] via SUBCUTANEOUS
  Filled 2016-08-16 (×3): qty 0.18

## 2016-08-16 NOTE — Progress Notes (Signed)
PROGRESS NOTE    Elizabeth Hendricks  GNF:[id number] DOB: 1961-03-03 DOA: 08/11/2016 PCP: Patient, No Pcp Per    Brief Narrative:  Elizabeth L Terenziis a 56 y.o.femalewith medical history significant of DM and hypothyroidism, HTN, HLD. Presents with abd pain, nausea and vomiting. She was found to have hydronephrosis and ureterolithiasis.  Urology consulted and she underwent cystoscopy with stent placement in the left ureter. Patient noted to be spiking fevers. Repeat CT abdomen and pelvis done. Patient has been pancultured. Results pending.     Assessment & Plan:   Principal Problem:   Acute renal failure (HCC) Active Problems:   Hydronephrosis due to obstruction of ureter   Complicated UTI (urinary tract infection)   Acute pyelonephritis   AKI (acute kidney injury) (HCC)   Nausea vomiting and diarrhea   Hyponatremia   Diabetes mellitus with complication (HCC)   CAD (coronary artery disease)   Hypothyroidism   Acute cystitis with hematuria   Fever   Ureteral stone with hydronephrosis  #1 acute renal failure Likely secondary to post renal azotemia from hydronephrosis and renal stones as noted per CT abdomen and pelvis which had a mild left-sided hydronephrosis with an obstructing large 1.1 x 0.7 cm stone at the distal left ureter just above left vesicourethral junction. Last creatinine noted on Epic from 12/15/2009 was 1.27. Patient status post cystoscopy with placement of double-J stent and left ureter per Dr.Dahtstedt. Creatinine was as high as 2.9 and trending down and currently at 1.25. Patient with good urine output of 2250 mL over the past 24 hours. Saline lock IV fluids. Discontinued ARB and HCTZ. Follow.  #2 2 left distal urethral stones with left hydro-ureteronephrosis As noted on CT abdomen and pelvis. Patient status post cystoscopy with placement of double-J stent and left ureter per urology. Renal function trending down. Continue empiric IV Rocephin for UTI. Per urology.  #3  complicated Escherichia coli urinary tract infection/acute pyelonephritis/fever Urine cultures with greater than 100,000 Escherichia coli. CT abdomen and pelvis done on admission with perinephric stranding. Continue IV Rocephin. Patient noted to spike fevers of 102.9, 102.4, 101.7. Fever curve starting to trend down. Patient with no significant respiratory symptoms. Repeat CT abdomen and pelvis negative for any abscess formation, no hydronephrosis, stent in place. Foley catheter has been ordered per urology. Fever curve seems to be trending down. As renal function improves and patient's fever curve trends down will transition to oral antibiotics and treat for total of 14 days. Chest x-ray done on 08/12/2016 with minimal retrocardiac opacity which could reflect mild pneumonia improved from prior study. Will check blood cultures 2. Repeat UA with cultures and sensitivities. Continue empiric IV Rocephin and azithromycin. Per urology continue Foley catheter until patient is afebrile and hemodynamically stable for at least 24 hours.   #4 hypothyroidism TSH 0.925. Continue home dose Synthroid.  #5 hyperlipidemia Continue statin.  #6 diabetes mellitus Hemoglobin A1c 8.0. CBGs ranging from 165-289. Increase Lantus to 18 units daily. Continue sliding scale insulin. Monitor blood glucose levels as renal function improves. Follow.  #7 hyponatremia Improved with hydration. Saline lock IV fluids.  #8 hypertension Stable. Discontinued ARB and HCTZ. Continue home regimen atenolol and Norvasc.  #9 Fall  Patient noted to have a mechanical fall during the hospitalization.Patient states improvement with left upper extremity pain. Plain films of left upper extremity negative for any acute fractures.   DVT prophylaxis: Heparin Code Status: Full Family Communication: Updated patient. No family at bedside.  Disposition Plan: Likely home once renal  function has improved and afebrile 24-48 hours and renal function  close to baseline and per urology.   Consultants:   Urology: Dr.Dahlstedt 08/11/2016    Procedures:   CT abdomen and pelvis 08/11/2016  Chest x-ray 08/11/2016, 08/12/2016  Plain films of the left humerus and left forearm 08/13/2016 Cystoscopy, left retrograde pyelogram, fluoroscopic interpretation, placement of double-J stent and left ureter (24 centimeter by 6 French contour without tether)  --Per Dr Retta Diones 08/11/2016 CT abdomen and pelvis 08/14/2016   Antimicrobials:   IV Rocephin 08/11/2016  IV azithromycin 08/14/2016   Subjective: Patient laying in bed. Patient denies chest pain. Patient denies shortness of breath. Patient asking when Foley catheter can be removed.   Objective: Vitals:   08/15/16 2058 08/16/16 0500 08/16/16 0535 08/16/16 1320  BP: 128/83  123/85 97/65  Pulse:   81 70  Resp: 18  17 17   Temp: 99.2 F (37.3 C)  98.6 F (37 C) 97.9 F (36.6 C)  TempSrc: Oral  Oral Oral  SpO2: 98%  99% 98%  Weight:  66.8 kg (147 lb 3.2 oz)    Height:        Intake/Output Summary (Last 24 hours) at 08/16/16 1859 Last data filed at 08/16/16 1734  Gross per 24 hour  Intake              720 ml  Output             2250 ml  Net            -1530 ml   Filed Weights   08/11/16 0251 08/15/16 0506 08/16/16 0500  Weight: 64.9 kg (143 lb) 67 kg (147 lb 11.2 oz) 66.8 kg (147 lb 3.2 oz)    Examination:  General exam: Appears calm and comfortable  Respiratory system: Clear to auscultation. No wheezing. Respiratory effort normal. Cardiovascular system: S1 & S2 heard, RRR. No JVD, murmurs, rubs, gallops or clicks. No pedal edema. Gastrointestinal system: Abdomen is nondistended, soft and nontender. No organomegaly or masses felt. Normal bowel sounds heard. Central nervous system: Alert and oriented. No focal neurological deficits. Extremities: Symmetric 5 x 5 power. Skin: No rashes, lesions or ulcers Psychiatry: Judgement and insight appear fair. Mood & affect  appropriate.     Data Reviewed: I have personally reviewed following labs and imaging studies  CBC:  Recent Labs Lab 08/12/16 0531 08/13/16 0412 08/14/16 0426 08/15/16 0610 08/16/16 0502  WBC 13.9* 14.6* 10.5 11.4* 11.8*  NEUTROABS 13.0* 12.7* 8.2* 8.9*  --   HGB 11.8* 12.0 10.5* 10.9* 10.7*  HCT 35.0* 35.6* 31.2* 32.3* 32.5*  MCV 94.1 95.2 94.0 92.0 93.7  PLT 130* 129* 98* 122* 165   Basic Metabolic Panel:  Recent Labs Lab 08/12/16 0531 08/13/16 0412 08/14/16 0426 08/15/16 0610 08/16/16 0502  NA 134* 134* 134* 135 130*  K 4.1 4.1 3.6 3.5 3.9  CL 105 105 105 103 100*  CO2 20* 19* 20* 23 19*  GLUCOSE 271* 210* 182* 168* 197*  BUN 55* 42* 33* 27* 31*  CREATININE 2.21* 1.81* 1.36* 1.25* 1.27*  CALCIUM 8.4* 8.7* 8.1* 8.5* 8.3*   GFR: Estimated Creatinine Clearance: 41.1 mL/min (A) (by C-G formula based on SCr of 1.27 mg/dL (H)). Liver Function Tests:  Recent Labs Lab 08/11/16 0303 08/12/16 0531  AST 40 25  ALT 39 27  ALKPHOS 87 73  BILITOT 1.2 0.6  PROT 6.6 6.2*  ALBUMIN 3.0* 2.5*    Recent Labs Lab 08/11/16 0303  LIPASE 15  No results for input(s): AMMONIA in the last 168 hours. Coagulation Profile: No results for input(s): INR, PROTIME in the last 168 hours. Cardiac Enzymes: No results for input(s): CKTOTAL, CKMB, CKMBINDEX, TROPONINI in the last 168 hours. BNP (last 3 results) No results for input(s): PROBNP in the last 8760 hours. HbA1C: No results for input(s): HGBA1C in the last 72 hours. CBG:  Recent Labs Lab 08/15/16 1656 08/15/16 2102 08/16/16 0742 08/16/16 1214 08/16/16 1727  GLUCAP 238* 289* 165* 203* 261*   Lipid Profile: No results for input(s): CHOL, HDL, LDLCALC, TRIG, CHOLHDL, LDLDIRECT in the last 72 hours. Thyroid Function Tests:  Recent Labs  08/15/16 0610  TSH 0.925   Anemia Panel: No results for input(s): VITAMINB12, FOLATE, FERRITIN, TIBC, IRON, RETICCTPCT in the last 72 hours. Sepsis Labs:  Recent  Labs Lab 08/11/16 0427  LATICACIDVEN 1.51    Recent Results (from the past 240 hour(s))  Urine culture     Status: Abnormal   Collection Time: 08/11/16  4:30 AM  Result Value Ref Range Status   Specimen Description URINE, RANDOM  Final   Special Requests ADDED [id number]  Final   Culture >=100,000 COLONIES/mL ESCHERICHIA COLI (A)  Final   Report Status 08/13/2016 FINAL  Final   Organism ID, Bacteria ESCHERICHIA COLI (A)  Final      Susceptibility   Escherichia coli - MIC*    AMPICILLIN >=32 RESISTANT Resistant     CEFAZOLIN <=4 SENSITIVE Sensitive     CEFTRIAXONE <=1 SENSITIVE Sensitive     CIPROFLOXACIN <=0.25 SENSITIVE Sensitive     GENTAMICIN <=1 SENSITIVE Sensitive     IMIPENEM <=0.25 SENSITIVE Sensitive     NITROFURANTOIN <=16 SENSITIVE Sensitive     TRIMETH/SULFA <=20 SENSITIVE Sensitive     AMPICILLIN/SULBACTAM 16 INTERMEDIATE Intermediate     PIP/TAZO <=4 SENSITIVE Sensitive     Extended ESBL NEGATIVE Sensitive     * >=100,000 COLONIES/mL ESCHERICHIA COLI  Culture, blood (Routine X 2) w Reflex to ID Panel     Status: None (Preliminary result)   Collection Time: 08/15/16  9:40 AM  Result Value Ref Range Status   Specimen Description BLOOD RIGHT ANTECUBITAL  Final   Special Requests   Final    BOTTLES DRAWN AEROBIC AND ANAEROBIC Blood Culture adequate volume   Culture NO GROWTH 1 DAY  Final   Report Status PENDING  Incomplete  Culture, blood (Routine X 2) w Reflex to ID Panel     Status: None (Preliminary result)   Collection Time: 08/15/16  9:47 AM  Result Value Ref Range Status   Specimen Description BLOOD LEFT WRIST  Final   Special Requests IN PEDIATRIC BOTTLE Blood Culture adequate volume  Final   Culture NO GROWTH 1 DAY  Final   Report Status PENDING  Incomplete  Urine culture     Status: None   Collection Time: 08/15/16 12:04 PM  Result Value Ref Range Status   Specimen Description URINE, RANDOM  Final   Special Requests NONE  Final   Culture NO GROWTH   Final   Report Status 08/16/2016 FINAL  Final         Radiology Studies: Ct Abdomen Pelvis Wo Contrast  Result Date: 08/14/2016 CLINICAL DATA:  Recent hydronephrosis status post stent placement fever EXAM: CT ABDOMEN AND PELVIS WITHOUT CONTRAST TECHNIQUE: Multidetector CT imaging of the abdomen and pelvis was performed following the standard protocol without IV contrast. COMPARISON:  08/11/2016 FINDINGS: Lower chest: Small left greater than right pleural  effusions. No consolidation. Minimal coronary artery calcification. Normal heart size. Hepatobiliary: No focal hepatic abnormality. Small layering stones in the gallbladder. No biliary dilatation Pancreas: Unremarkable. No pancreatic ductal dilatation or surrounding inflammatory changes. Spleen: Normal in size without focal abnormality. Adrenals/Urinary Tract: Adrenal glands are within normal limits. Moderate left greater than right perinephric fat stranding. Multiple intrarenal calculi on the right similar compared to prior, measuring up to 4 mm in size. Multiple intrarenal calcifications on the left, largest measures 7 mm at the midpole. Placement of a a left ureteral stent. Minimal left hydronephrosis not significantly worsened. Decreased hydroureter. 11 mm stone along the lateral aspect of distal stent, just proximal to left UVJ. Foley catheter present. Bladder is decompressed. Distal portion of the stent is within the left aspect of the bladder. Stomach/Bowel: Stomach nonenlarged. No dilated small bowel. No colon wall thickening. Normal appendix. Vascular/Lymphatic: Aortic atherosclerosis. No enlarged abdominal or pelvic lymph nodes. Reproductive: No adnexal masses. Other: No free air or free fluid.  Tiny fat in the umbilicus. Musculoskeletal: Degenerative changes. No acute or suspicious bone lesion. IMPRESSION: 1. Interim placement of a a left ureteral stent with minimal residual left hydronephrosis and decreased hydroureter. No gross perinephric  fluid collection seen allowing for the absence of intravenous contrast. Perinephric fat stranding is nonspecific. Residual 11 mm stone adjacent to the distal portion of left ureteral stent, the smaller stone that was previously noted superior to this is not clearly identified. 2. Bilateral intrarenal calculi. 3. Increased small pleural effusions. 4. Gallstones Electronically Signed   By: Jasmine Pang M.D.   On: 08/14/2016 23:34        Scheduled Meds: . amLODipine  5 mg Oral Daily  . aspirin EC  81 mg Oral Daily  . atenolol  50 mg Oral BID  . azithromycin  500 mg Oral Daily  . famotidine  20 mg Oral Daily  . heparin  5,000 Units Subcutaneous Q8H  . insulin aspart  0-5 Units Subcutaneous QHS  . insulin aspart  0-9 Units Subcutaneous TID WC  . insulin glargine  18 Units Subcutaneous QHS  . levothyroxine  125 mcg Oral QAC breakfast  . simvastatin  20 mg Oral q1800  . sodium bicarbonate  50 mEq Intravenous Once  . sodium chloride flush  3 mL Intravenous Q12H   Continuous Infusions: . cefTRIAXone (ROCEPHIN)  IV Stopped (08/16/16 0556)     LOS: 5 days    Time spent: 40 minutes    Whittley Carandang, MD [facility] Pager [phone]  If 7PM-7AM, please contact night-coverage [email or web address] Password [facility] 08/16/2016, 6:59 PM

## 2016-08-17 LAB — CBC WITH DIFFERENTIAL/PLATELET
Basophils Absolute: 0 10*3/uL (ref 0.0–0.1)
Basophils Relative: 0 %
Eosinophils Absolute: 0.2 10*3/uL (ref 0.0–0.7)
Eosinophils Relative: 2 %
HCT: 34.2 % — ABNORMAL LOW (ref 36.0–46.0)
Hemoglobin: 11.2 g/dL — ABNORMAL LOW (ref 12.0–15.0)
Lymphocytes Relative: 12 %
Lymphs Abs: 1.4 10*3/uL (ref 0.7–4.0)
MCH: 30.9 pg (ref 26.0–34.0)
MCHC: 32.7 g/dL (ref 30.0–36.0)
MCV: 94.2 fL (ref 78.0–100.0)
Monocytes Absolute: 0.9 10*3/uL (ref 0.1–1.0)
Monocytes Relative: 8 %
Neutro Abs: 8.8 10*3/uL — ABNORMAL HIGH (ref 1.7–7.7)
Neutrophils Relative %: 78 %
Platelets: 239 10*3/uL (ref 150–400)
RBC: 3.63 MIL/uL — ABNORMAL LOW (ref 3.87–5.11)
RDW: 13.3 % (ref 11.5–15.5)
WBC: 11.4 10*3/uL — ABNORMAL HIGH (ref 4.0–10.5)

## 2016-08-17 LAB — GLUCOSE, CAPILLARY
GLUCOSE-CAPILLARY: 105 mg/dL — AB (ref 65–99)
GLUCOSE-CAPILLARY: 195 mg/dL — AB (ref 65–99)
Glucose-Capillary: 143 mg/dL — ABNORMAL HIGH (ref 65–99)
Glucose-Capillary: 368 mg/dL — ABNORMAL HIGH (ref 65–99)

## 2016-08-17 LAB — BASIC METABOLIC PANEL
ANION GAP: 7 (ref 5–15)
BUN: 31 mg/dL — AB (ref 6–20)
CALCIUM: 8.7 mg/dL — AB (ref 8.9–10.3)
CO2: 23 mmol/L (ref 22–32)
CREATININE: 1.31 mg/dL — AB (ref 0.44–1.00)
Chloride: 104 mmol/L (ref 101–111)
GFR calc Af Amer: 52 mL/min — ABNORMAL LOW (ref >60)
GFR, EST NON AFRICAN AMERICAN: 45 mL/min — AB (ref >60)
GLUCOSE: 140 mg/dL — AB (ref 65–99)
Potassium: 4.2 mmol/L (ref 3.5–5.1)
Sodium: 134 mmol/L — ABNORMAL LOW (ref 135–145)

## 2016-08-17 MED ORDER — LEVOFLOXACIN 750 MG PO TABS
750.0000 mg | ORAL_TABLET | ORAL | Status: DC
  Administered 2016-08-17 – 2016-08-18 (×2): 750 mg via ORAL
  Filled 2016-08-17 (×2): qty 1

## 2016-08-17 MED ORDER — INSULIN ASPART 100 UNIT/ML ~~LOC~~ SOLN
3.0000 [IU] | Freq: Three times a day (TID) | SUBCUTANEOUS | Status: DC
Start: 2016-08-17 — End: 2016-08-18
  Administered 2016-08-18 (×2): 3 [IU] via SUBCUTANEOUS

## 2016-08-17 NOTE — Progress Notes (Signed)
PROGRESS NOTE    Elizabeth Hendricks  ZOX:[id number] DOB: November 05, 1960 DOA: 08/11/2016 PCP: Patient, No Pcp Per    Brief Narrative:  Elizabeth L Terenziis a 56 y.o.femalewith medical history significant of DM and hypothyroidism, HTN, HLD. Presents with abd pain, nausea and vomiting. She was found to have hydronephrosis and ureterolithiasis.  Urology consulted and she underwent cystoscopy with stent placement in the left ureter. Patient noted to be spiking fevers. Repeat CT abdomen and pelvis done. Patient has been pancultured. Results pending.     Assessment & Plan:   Principal Problem:   Acute renal failure (HCC) Active Problems:   Hydronephrosis due to obstruction of ureter   Complicated UTI (urinary tract infection)   Acute pyelonephritis   AKI (acute kidney injury) (HCC)   Nausea vomiting and diarrhea   Hyponatremia   Diabetes mellitus with complication (HCC)   CAD (coronary artery disease)   Hypothyroidism   Acute cystitis with hematuria   Fever   Ureteral stone with hydronephrosis  #1 acute renal failure Likely secondary to post renal azotemia from hydronephrosis and renal stones as noted per CT abdomen and pelvis which had a mild left-sided hydronephrosis with an obstructing large 1.1 x 0.7 cm stone at the distal left ureter just above left vesicourethral junction. Last creatinine noted on Epic from 12/15/2009 was 1.27. Patient status post cystoscopy with placement of double-J stent and left ureter per Dr.Dahtstedt. Creatinine was as high as 2.9 and trending down and currently at 1.31. Patient with good urine output of 2350 mL over the past 24 hours. Saline lock IV fluids. Discontinued ARB and HCTZ. Follow.  #2 2 left distal urethral stones with left hydro-ureteronephrosis As noted on CT abdomen and pelvis. Patient status post cystoscopy with placement of double-J stent and left ureter per urology. Renal function trending down. DC IV Rocephin and placed on oral Levaquin for  UTI/pyelonephritis. Per urology.  #3 complicated Escherichia coli urinary tract infection/acute pyelonephritis/fever Urine cultures with greater than 100,000 Escherichia coli. CT abdomen and pelvis done on admission with perinephric stranding. Continue IV Rocephin. Patient noted to spike fevers of 102.9, 102.4, 101.7. Fever curve starting to trend down. Patient with no significant respiratory symptoms. Repeat CT abdomen and pelvis negative for any abscess formation, no hydronephrosis, stent in place. Foley catheter has been ordered per urology. Fever curve seems to be trending down. As renal function improves and patient's fever curve trends down will transition to oral antibiotics and treat for total of 14 days. Chest x-ray done on 08/12/2016 with minimal retrocardiac opacity which could reflect mild pneumonia improved from prior study. Will check blood cultures 2. Repeat UA with cultures and sensitivities. Continue empiric azithromycin. Discontinue IV Rocephin and placed on oral Levaquin to complete a two-week course of antibiotic treatment. Patient has been afebrile now for the past 24-48 hours. Will discontinue Foley catheter. Monitor closely.   #4 hypothyroidism TSH 0.925. Continue home dose Synthroid.  #5 hyperlipidemia Continue statin.  #6 diabetes mellitus Hemoglobin A1c 8.0. CBGs ranging from 143-368. Continue Lantus to 18 units daily. Continue sliding scale insulin. Will place her meal coverage insulin 3 units with meals. Monitor blood glucose levels as renal function improves. Follow.  #7 hyponatremia Improved with hydration. Saline lock IV fluids.  #8 hypertension Stable. Discontinued ARB and HCTZ. Continue home regimen atenolol and Norvasc.  #9 Fall  Patient noted to have a mechanical fall during the hospitalization.Patient states improvement with left upper extremity pain. Plain films of left upper extremity negative for  any acute fractures.   DVT prophylaxis: Heparin Code  Status: Full Family Communication: Updated patient. No family at bedside.  Disposition Plan: Likely home once renal function has improved and afebrile 24-48 hours and renal function close to baseline and per urology hopefully tomorrow 08/18/2016.   Consultants:   Urology: Dr.Dahlstedt 08/11/2016    Procedures:   CT abdomen and pelvis 08/11/2016  Chest x-ray 08/11/2016, 08/12/2016  Plain films of the left humerus and left forearm 08/13/2016 Cystoscopy, left retrograde pyelogram, fluoroscopic interpretation, placement of double-J stent and left ureter (24 centimeter by 6 French contour without tether)  --Per Dr Retta Diones 08/11/2016 CT abdomen and pelvis 08/14/2016   Antimicrobials:   IV Rocephin 08/11/2016>>>>> 08/17/2016  IV azithromycin 08/14/2016  Oral Levaquin 08/17/2016   Subjective: Patient sitting up in chair. Patient denies chest pain. No shortness of breath. Patient happy that for the catheter has finally been discontinued. Afebrile overnight.   Objective: Vitals:   08/16/16 1320 08/16/16 2101 08/17/16 0521 08/17/16 1244  BP: 97/65 128/80 123/80 113/68  Pulse: 70 90 80 73  Resp: 17 16 16 16   Temp: 97.9 F (36.6 C) 99.4 F (37.4 C) 98.9 F (37.2 C) 98.4 F (36.9 C)  TempSrc: Oral Oral Oral Oral  SpO2: 98% 99% 99% 99%  Weight:      Height:        Intake/Output Summary (Last 24 hours) at 08/17/16 1352 Last data filed at 08/17/16 0908  Gross per 24 hour  Intake              540 ml  Output             1852 ml  Net            -1312 ml   Filed Weights   08/11/16 0251 08/15/16 0506 08/16/16 0500  Weight: 64.9 kg (143 lb) 67 kg (147 lb 11.2 oz) 66.8 kg (147 lb 3.2 oz)    Examination:  General exam: Appears calm and comfortable  Respiratory system: Clear to auscultation. No wheezing. Respiratory effort normal. Cardiovascular system: S1 & S2 heard, RRR. No JVD, murmurs, rubs, gallops or clicks. No pedal edema. Gastrointestinal system: Abdomen is  nondistended, soft and nontender. No organomegaly or masses felt. Normal bowel sounds heard. Central nervous system: Alert and oriented. No focal neurological deficits. Extremities: Symmetric 5 x 5 power. Skin: No rashes, lesions or ulcers Psychiatry: Judgement and insight appear fair. Mood & affect appropriate.     Data Reviewed: I have personally reviewed following labs and imaging studies  CBC:  Recent Labs Lab 08/12/16 0531 08/13/16 0412 08/14/16 0426 08/15/16 0610 08/16/16 0502 08/17/16 0426  WBC 13.9* 14.6* 10.5 11.4* 11.8* 11.4*  NEUTROABS 13.0* 12.7* 8.2* 8.9*  --  8.8*  HGB 11.8* 12.0 10.5* 10.9* 10.7* 11.2*  HCT 35.0* 35.6* 31.2* 32.3* 32.5* 34.2*  MCV 94.1 95.2 94.0 92.0 93.7 94.2  PLT 130* 129* 98* 122* 165 239   Basic Metabolic Panel:  Recent Labs Lab 08/13/16 0412 08/14/16 0426 08/15/16 0610 08/16/16 0502 08/17/16 0426  NA 134* 134* 135 130* 134*  K 4.1 3.6 3.5 3.9 4.2  CL 105 105 103 100* 104  CO2 19* 20* 23 19* 23  GLUCOSE 210* 182* 168* 197* 140*  BUN 42* 33* 27* 31* 31*  CREATININE 1.81* 1.36* 1.25* 1.27* 1.31*  CALCIUM 8.7* 8.1* 8.5* 8.3* 8.7*   GFR: Estimated Creatinine Clearance: 39.8 mL/min (A) (by C-G formula based on SCr of 1.31 mg/dL (H)). Liver Function Tests:  Recent Labs Lab 08/11/16 0303 08/12/16 0531  AST 40 25  ALT 39 27  ALKPHOS 87 73  BILITOT 1.2 0.6  PROT 6.6 6.2*  ALBUMIN 3.0* 2.5*    Recent Labs Lab 08/11/16 0303  LIPASE 15   No results for input(s): AMMONIA in the last 168 hours. Coagulation Profile: No results for input(s): INR, PROTIME in the last 168 hours. Cardiac Enzymes: No results for input(s): CKTOTAL, CKMB, CKMBINDEX, TROPONINI in the last 168 hours. BNP (last 3 results) No results for input(s): PROBNP in the last 8760 hours. HbA1C: No results for input(s): HGBA1C in the last 72 hours. CBG:  Recent Labs Lab 08/16/16 1214 08/16/16 1727 08/16/16 2100 08/17/16 0800 08/17/16 1245  GLUCAP 203*  261* 274* 143* 368*   Lipid Profile: No results for input(s): CHOL, HDL, LDLCALC, TRIG, CHOLHDL, LDLDIRECT in the last 72 hours. Thyroid Function Tests:  Recent Labs  08/15/16 0610  TSH 0.925   Anemia Panel: No results for input(s): VITAMINB12, FOLATE, FERRITIN, TIBC, IRON, RETICCTPCT in the last 72 hours. Sepsis Labs:  Recent Labs Lab 08/11/16 0427  LATICACIDVEN 1.51    Recent Results (from the past 240 hour(s))  Urine culture     Status: Abnormal   Collection Time: 08/11/16  4:30 AM  Result Value Ref Range Status   Specimen Description URINE, RANDOM  Final   Special Requests ADDED [id number]  Final   Culture >=100,000 COLONIES/mL ESCHERICHIA COLI (A)  Final   Report Status 08/13/2016 FINAL  Final   Organism ID, Bacteria ESCHERICHIA COLI (A)  Final      Susceptibility   Escherichia coli - MIC*    AMPICILLIN >=32 RESISTANT Resistant     CEFAZOLIN <=4 SENSITIVE Sensitive     CEFTRIAXONE <=1 SENSITIVE Sensitive     CIPROFLOXACIN <=0.25 SENSITIVE Sensitive     GENTAMICIN <=1 SENSITIVE Sensitive     IMIPENEM <=0.25 SENSITIVE Sensitive     NITROFURANTOIN <=16 SENSITIVE Sensitive     TRIMETH/SULFA <=20 SENSITIVE Sensitive     AMPICILLIN/SULBACTAM 16 INTERMEDIATE Intermediate     PIP/TAZO <=4 SENSITIVE Sensitive     Extended ESBL NEGATIVE Sensitive     * >=100,000 COLONIES/mL ESCHERICHIA COLI  Culture, blood (Routine X 2) w Reflex to ID Panel     Status: None (Preliminary result)   Collection Time: 08/15/16  9:40 AM  Result Value Ref Range Status   Specimen Description BLOOD RIGHT ANTECUBITAL  Final   Special Requests   Final    BOTTLES DRAWN AEROBIC AND ANAEROBIC Blood Culture adequate volume   Culture NO GROWTH 1 DAY  Final   Report Status PENDING  Incomplete  Culture, blood (Routine X 2) w Reflex to ID Panel     Status: None (Preliminary result)   Collection Time: 08/15/16  9:47 AM  Result Value Ref Range Status   Specimen Description BLOOD LEFT WRIST  Final    Special Requests IN PEDIATRIC BOTTLE Blood Culture adequate volume  Final   Culture NO GROWTH 1 DAY  Final   Report Status PENDING  Incomplete  Urine culture     Status: None   Collection Time: 08/15/16 12:04 PM  Result Value Ref Range Status   Specimen Description URINE, RANDOM  Final   Special Requests NONE  Final   Culture NO GROWTH  Final   Report Status 08/16/2016 FINAL  Final         Radiology Studies: No results found.      Scheduled Meds: . amLODipine  5 mg Oral Daily  . aspirin EC  81 mg Oral Daily  . atenolol  50 mg Oral BID  . azithromycin  500 mg Oral Daily  . famotidine  20 mg Oral Daily  . heparin  5,000 Units Subcutaneous Q8H  . insulin aspart  0-5 Units Subcutaneous QHS  . insulin aspart  0-9 Units Subcutaneous TID WC  . insulin aspart  3 Units Subcutaneous TID WC  . insulin glargine  18 Units Subcutaneous QHS  . levofloxacin  750 mg Oral Q48H  . levothyroxine  125 mcg Oral QAC breakfast  . simvastatin  20 mg Oral q1800  . sodium bicarbonate  50 mEq Intravenous Once  . sodium chloride flush  3 mL Intravenous Q12H   Continuous Infusions:    LOS: 6 days    Time spent: 40 minutes    Karli Wickizer, MD [facility] Pager [phone]  If 7PM-7AM, please contact night-coverage [email or web address] Password [email or web address] 08/17/2016, 1:52 PM

## 2016-08-18 LAB — GLUCOSE, CAPILLARY
Glucose-Capillary: 141 mg/dL — ABNORMAL HIGH (ref 65–99)
Glucose-Capillary: 192 mg/dL — ABNORMAL HIGH (ref 65–99)

## 2016-08-18 LAB — BASIC METABOLIC PANEL
Anion gap: 8 (ref 5–15)
BUN: 30 mg/dL — AB (ref 6–20)
CHLORIDE: 104 mmol/L (ref 101–111)
CO2: 23 mmol/L (ref 22–32)
CREATININE: 1.35 mg/dL — AB (ref 0.44–1.00)
Calcium: 8.5 mg/dL — ABNORMAL LOW (ref 8.9–10.3)
GFR calc Af Amer: 50 mL/min — ABNORMAL LOW (ref >60)
GFR calc non Af Amer: 43 mL/min — ABNORMAL LOW (ref >60)
Glucose, Bld: 146 mg/dL — ABNORMAL HIGH (ref 65–99)
Potassium: 3.8 mmol/L (ref 3.5–5.1)
Sodium: 135 mmol/L (ref 135–145)

## 2016-08-18 MED ORDER — LEVOFLOXACIN 750 MG PO TABS: 750.0000 mg | ORAL_TABLET | ORAL | 0 refills | Status: AC

## 2016-08-18 MED ORDER — ZOLPIDEM TARTRATE 5 MG PO TABS
5.0000 mg | ORAL_TABLET | Freq: Once | ORAL | Status: DC
  Filled 2016-08-18: qty 1

## 2016-08-18 MED ORDER — AMLODIPINE BESYLATE 5 MG PO TABS: 5.0000 mg | ORAL_TABLET | Freq: Every day | ORAL | 1 refills | Status: DC

## 2016-08-18 NOTE — Discharge Summary (Signed)
Physician Discharge Summary  Elizabeth Hendricks OVF:[id number] DOB: Feb 05, 1961 DOA: 08/11/2016  PCP: Patient, No Pcp Per  Admit date: 08/11/2016 Discharge date: 08/18/2016  Time spent: 60 minutes  Recommendations for Outpatient Follow-up:  1. Follow-up with Elizabeth Hendricks. 2. Follow-up with PCP in 2 weeks. Patient will need a basic metabolic profile done to follow-up on electrolytes and renal function. Patient's blood pressure also needs to be reassessed at that time. Patient's diabetes also needs to be reassessed.   Discharge Diagnoses:  Principal Problem:   Acute renal failure (HCC) Active Problems:   Hydronephrosis due to obstruction of ureter   Complicated UTI (urinary tract infection)   Acute pyelonephritis   AKI (acute kidney injury) (HCC)   Nausea vomiting and diarrhea   Hyponatremia   Diabetes mellitus with complication (HCC)   CAD (coronary artery disease)   Hypothyroidism   Acute cystitis with hematuria   Fever   Ureteral stone with hydronephrosis   Discharge Condition: Full  Diet recommendation: Carb modified.  Filed Weights   08/15/16 0506 08/16/16 0500 08/18/16 0500  Weight: 67 kg (147 lb 11.2 oz) 66.8 kg (147 lb 3.2 oz) 66.4 kg (146 lb 6.4 oz)    History of present illness:  Per ElizabethMerrell Elizabeth Hendricks is a 56 y.o. female with medical history significant of DM and hypothyroidism, HTN, HLD. History limited secondary to patient being in a postoperative, post anesthesia state. Per review of her medical notes and discussion with the patient initially presented with three-day history of nausea, vomiting and diarrhea, abdominal pain. These episodes were nonbilious, nonbloody. Symptoms were getting worse prior to surgery. Nothing makes symptoms better. One day ago patient developed lower middle abdominal pain. On the morning of admission patient's husband found her basically unresponsive in the bathroom and patient brought to the ED. Evaluation in the ED patient was noted to  have a large left distal urethral stone, hydronephrosis, evidence of UTI, acute renal failure. Patient remained afebrile however had low blood pressure tachypnea. Lactic acid level is 1.5. Urology was consulted and patient was taken urgently to the OR where stent was placed for urgent decompression of the kidney. Postoperatively pts only complaint is a feeling of needing to urinate all the time. Denied chest pain, palpitations, shortness of breath, fevers, dysuria, back pain, neck stiffness, focal neurological deficit.   ED Course: Objective findings outlined below. Patient found to have, to left distal urethral stones with left hydroureteronephrosis associated UTI, low blood pressure, acute renal failure. Urology consulted and patient taken emergently to the operating room for urgent decompression of her kidneys.   Hospital Course:  #1 acute renal failure Likely secondary to post renal azotemia from hydronephrosis and renal stones as noted per CT abdomen and pelvis which had a mild left-sided hydronephrosis with an obstructing large 1.1 x 0.7 cm stone at the distal left ureter just above left vesicourethral junction. Last creatinine noted on Epic from 12/15/2009 was 1.27. Patient status post cystoscopy with placement of double-J stent and left ureter per ElizabethDahtstedt. Creatinine was as high as 2.9 and trended down such that by day of discharge patient's creatinine was down to 1.35. Patient's ARB and HCTZ was discontinued. Patient had good urine output postoperatively. Outpatient follow-up with PCP and urology.   #2 2 left distal urethral stones with left hydro-ureteronephrosis As noted on CT abdomen and pelvis. Patient underwent emergent cystoscopy with placement of double-J stent and left ureter per urology for decompression of kidneys. Patient's renal function improved and trended back  down to baseline by day of discharge. Patient was initially placed on IV Rocephin and urine cultures obtained were  positive for Escherichia coli. As patient improved clinically patient was transitioned to oral Levaquin to complete a two-week course of antibiotic treatment for complicated UTI/pyelonephritis. Patient will follow-up with urology in outpatient setting.   #3 complicated Escherichia coli urinary tract infection/acute pyelonephritis/fever Urine cultures with greater than 100,000 Escherichia coli. CT abdomen and pelvis done on admission with perinephric stranding. Patient was placed empirically on IV Rocephin. Patient post cystoscopy and stent placement was noted to spike fevers of 102.9, 102.4, 101.7. Fever curve starting to trend down. Patient with no significant respiratory symptoms. Repeat CT abdomen and pelvis negative for any abscess formation, no hydronephrosis, stent in place. Foley catheter was ordered per urology until patient was afebrile for 24 hours prior to discontinuation of Foley catheter. Fever curve trended down and patient was afebrile for 48 hours prior to discharge.  As renal function improved and patient's fever curve trended down patient was transitioned from IV antibiotics to oral Levaquin to treat for total of 14 days. Chest x-ray done on 08/12/2016 with minimal retrocardiac opacity which could reflect mild pneumonia improved from prior study. Blood cultures ordered with no growth to date. Repeat UA with cultures and sensitivities unremarkable. Patient was initially on IV Rocephin and azithromycin added to patient's regimen. As patient improved and defervesced was transitioned to oral Levaquin to complete a two-week course of antibiotic treatment. Patient was discharged home on oral Levaquin 750 mg every other day 1 week. Outpatient follow-up with urology.   #4 hypothyroidism TSH 0.925. Patient maintained on home dose Synthroid.  #5 hyperlipidemia Patient was maintained on a statin.   #6 diabetes mellitus Hemoglobin A1c 8.0. Patient was started on Lantus dose adjusted to 18  units daily. Patient's oral hypoglycemic agents were held throughout the hospitalization. Patient was maintained on sliding scale insulin as well as meal coverage insulin with good blood glucose control. Outpatient follow-up.  #7 hyponatremia Improved with hydration.   #8 hypertension Stable. Discontinued ARB and HCTZ secondary to acute renal failure. Patient was maintained on atenolol and Norvasc during the hospitalization with good blood pressure control. Outpatient follow-up.  #9 Fall  Patient noted to have a mechanical fall during the hospitalization.Patient initially was complaint of left upper extremity pain. Plain films of left upper extremity negative for any acute fractures. Patient's pain improved and had resolved by day of discharge.    Procedures:  CT abdomen and pelvis 08/11/2016  Chest x-ray 08/11/2016, 08/12/2016  Plain films of the left humerus and left forearm 08/13/2016 Cystoscopy, left retrograde pyelogram, fluoroscopic interpretation, placement of double-J stent and left ureter (24 centimeter by 6 French contour without tether)--Per Dr Retta Hendricks 08/11/2016 CT abdomen and pelvis 08/14/2016  Consultations:  Urology: ElizabethDahlstedt 08/11/2016  Discharge Exam: Vitals:   08/18/16 0655 08/18/16 1330  BP: 115/81 113/67  Pulse: 80 79  Resp: 18 19  Temp: 99.1 F (37.3 C) 98.2 F (36.8 C)    General: NAD Cardiovascular: RRR Respiratory: CTAB  Discharge Instructions   Discharge Instructions    Diet Carb Modified    Complete by:  As directed    Increase activity slowly    Complete by:  As directed      Current Discharge Medication List    START taking these medications   Details  amLODipine (NORVASC) 5 MG tablet Take 1 tablet (5 mg total) by mouth daily. Qty: 30 tablet, Refills: 1  levofloxacin (LEVAQUIN) 750 MG tablet Take 1 tablet (750 mg total) by mouth every other day. Qty: 3 tablet, Refills: 0      CONTINUE these medications which  have NOT CHANGED   Details  aspirin EC 81 MG tablet Take 81 mg by mouth daily.    atenolol (TENORMIN) 50 MG tablet Take 50 mg by mouth 2 (two) times daily.    co-enzyme Q-10 30 MG capsule Take 30 mg by mouth daily.    ibuprofen (ADVIL,MOTRIN) 200 MG tablet Take 200-800 mg by mouth every 6 (six) hours as needed for moderate pain.    levothyroxine (SYNTHROID, LEVOTHROID) 125 MCG tablet Take 125 mcg by mouth daily before breakfast.    metFORMIN (GLUCOPHAGE) 500 MG tablet Take 1,000 mg by mouth 2 (two) times daily with a meal.    omega-3 acid ethyl esters (LOVAZA) 1 g capsule Take 1 g by mouth daily.    simvastatin (ZOCOR) 20 MG tablet Take 20 mg by mouth daily.      STOP taking these medications     losartan-hydrochlorothiazide (HYZAAR) 100-12.5 MG tablet        No Known Allergies Follow-up Information    Marcine Matarahlstedt, Stephen, MD Follow up.   Specialty:  Urology Why:  call for followup appt once home to discuss eventual surgery Contact information: [address] [place] [address] [phone]        PCP. Schedule an appointment as soon as possible for a visit in 2 week(s).   Why:  f/u with PCP in 2 weeks.           The results of significant diagnostics from this hospitalization (including imaging, microbiology, ancillary and laboratory) are listed below for reference.    Significant Diagnostic Studies: Ct Abdomen Pelvis Wo Contrast  Result Date: 08/14/2016 CLINICAL DATA:  Recent hydronephrosis status post stent placement fever EXAM: CT ABDOMEN AND PELVIS WITHOUT CONTRAST TECHNIQUE: Multidetector CT imaging of the abdomen and pelvis was performed following the standard protocol without IV contrast. COMPARISON:  08/11/2016 FINDINGS: Lower chest: Small left greater than right pleural effusions. No consolidation. Minimal coronary artery calcification. Normal heart size. Hepatobiliary: No focal hepatic abnormality. Small layering stones in the gallbladder. No biliary  dilatation Pancreas: Unremarkable. No pancreatic ductal dilatation or surrounding inflammatory changes. Spleen: Normal in size without focal abnormality. Adrenals/Urinary Tract: Adrenal glands are within normal limits. Moderate left greater than right perinephric fat stranding. Multiple intrarenal calculi on the right similar compared to prior, measuring up to 4 mm in size. Multiple intrarenal calcifications on the left, largest measures 7 mm at the midpole. Placement of a a left ureteral stent. Minimal left hydronephrosis not significantly worsened. Decreased hydroureter. 11 mm stone along the lateral aspect of distal stent, just proximal to left UVJ. Foley catheter present. Bladder is decompressed. Distal portion of the stent is within the left aspect of the bladder. Stomach/Bowel: Stomach nonenlarged. No dilated small bowel. No colon wall thickening. Normal appendix. Vascular/Lymphatic: Aortic atherosclerosis. No enlarged abdominal or pelvic lymph nodes. Reproductive: No adnexal masses. Other: No free air or free fluid.  Tiny fat in the umbilicus. Musculoskeletal: Degenerative changes. No acute or suspicious bone lesion. IMPRESSION: 1. Interim placement of a a left ureteral stent with minimal residual left hydronephrosis and decreased hydroureter. No gross perinephric fluid collection seen allowing for the absence of intravenous contrast. Perinephric fat stranding is nonspecific. Residual 11 mm stone adjacent to the distal portion of left ureteral stent, the smaller stone that was previously noted superior  to this is not clearly identified. 2. Bilateral intrarenal calculi. 3. Increased small pleural effusions. 4. Gallstones Electronically Signed   By: Jasmine Pang M.D.   On: 08/14/2016 23:34   Ct Abdomen Pelvis Wo Contrast  Result Date: 08/11/2016 CLINICAL DATA:  Acute onset of generalized abdominal pain. Initial encounter. EXAM: CT ABDOMEN AND PELVIS WITHOUT CONTRAST TECHNIQUE: Multidetector CT imaging of  the abdomen and pelvis was performed following the standard protocol without IV contrast. COMPARISON:  CT of the abdomen and pelvis performed 12/15/2009 FINDINGS: Lower chest: A trace left pleural effusion is noted, mildly loculated in appearance, with left basilar airspace opacification. Underlying pneumonia cannot be excluded. Diffuse coronary artery calcifications are seen. Hepatobiliary: The liver is unremarkable in appearance. Stones are seen dependently within the gallbladder. The common bile duct remains normal in caliber. Pancreas: The pancreas is within normal limits. Spleen: The spleen is unremarkable in appearance. Adrenals/Urinary Tract: The adrenal glands are grossly unremarkable in appearance. There is mild left-sided hydronephrosis, with prominence of the left ureter along its entire course. An obstructing large 1.1 x 0.7 cm stone is noted at the distal left ureter, just above the left vesicoureteral junction, with an adjacent smaller 0.5 cm stone seen more proximally. Bilateral perinephric stranding is noted. Nonobstructing bilateral renal stones measure up to 0.7 cm in size. Stomach/Bowel: The stomach is unremarkable in appearance. The small bowel is within normal limits. The appendix is normal in caliber, without evidence of appendicitis. The colon is unremarkable in appearance. Vascular/Lymphatic: Scattered calcification is seen along the abdominal aorta and its branches. The abdominal aorta is otherwise grossly unremarkable. The inferior vena cava is grossly unremarkable. No retroperitoneal lymphadenopathy is seen. No pelvic sidewall lymphadenopathy is identified. Reproductive: The bladder is decompressed and not well assessed. The uterus is grossly unremarkable in appearance. No suspicious adnexal masses are seen. Other: No additional soft tissue abnormalities are seen. Musculoskeletal: No acute osseous abnormalities are identified. Facet disease is noted at the lower lumbar spine. The  visualized musculature is unremarkable in appearance. IMPRESSION: 1. Mild left-sided hydronephrosis, with an obstructing large 1.1 x 0.7 cm stone at the distal left ureter, just above the left vesicoureteral junction. Adjacent smaller 0.5 cm stone seen more proximally. 2. Nonobstructing bilateral renal stones measure up to 0.7 cm in size. 3. Trace left pleural effusion, mildly loculated in appearance, with left basilar airspace opacification. Underlying pneumonia cannot be excluded. 4. Diffuse coronary artery calcifications seen. 5. Scattered aortic atherosclerosis. Electronically Signed   By: Roanna Raider M.D.   On: 08/11/2016 04:57   Dg Chest 2 View  Result Date: 08/12/2016 CLINICAL DATA:  Acute onset of shortness of breath. Initial encounter. EXAM: CHEST  2 VIEW COMPARISON:  Chest radiograph performed 08/11/2016 FINDINGS: A small left pleural effusion is noted. Minimal retrocardiac opacity could reflect mild pneumonia, improved from the prior study. No pneumothorax is seen. The heart is normal in size. No acute osseous abnormalities identified. IMPRESSION: Small left pleural effusion noted. Minimal retrocardiac opacity could reflect mild pneumonia, improved from the prior study. Electronically Signed   By: Roanna Raider M.D.   On: 08/12/2016 22:22   Dg Chest 2 View  Result Date: 08/11/2016 CLINICAL DATA:  Acute onset of shortness of breath and generalized chest pain. Initial encounter. EXAM: CHEST  2 VIEW COMPARISON:  CTA of the chest performed 12/16/2009 FINDINGS: The lungs are well-aerated. Mild left basilar airspace opacity may reflect atelectasis or possibly mild pneumonia. There is no evidence of pleural effusion or pneumothorax. The  heart is borderline normal in size. No acute osseous abnormalities are seen. IMPRESSION: Mild left basilar airspace opacity may reflect atelectasis or possibly mild pneumonia. Electronically Signed   By: Roanna Raider M.D.   On: 08/11/2016 04:43   Dg Forearm  Left  Result Date: 08/13/2016 CLINICAL DATA:  Status post fall in bathroom, with proximal left forearm pain. Initial encounter. EXAM: LEFT FOREARM - 2 VIEW COMPARISON:  None. FINDINGS: Deformity at the proximal radius and ulna appears to be chronic in nature, reflecting remote injury. No definite elbow joint effusion is seen. There is mild chronic deformity about the distal radius and ulna. A peripheral IV catheter is noted overlying the antecubital fossa. No definite soft tissue abnormalities are characterized on radiograph. The carpal rows appear grossly intact. IMPRESSION: 1. No definite evidence of acute fracture or dislocation. 2. Apparent chronic deformity noted at the proximal radius and ulna, and also at the distal radius and ulna, reflecting remote injury. Electronically Signed   By: Roanna Raider M.D.   On: 08/13/2016 22:17   Dg Humerus Left  Result Date: 08/13/2016 CLINICAL DATA:  Status post fall in bathroom, with left distal arm pain. Initial encounter. EXAM: LEFT HUMERUS - 2+ VIEW COMPARISON:  None. FINDINGS: There is no evidence of fracture or dislocation. There is chronic deformity of the distal humerus, and proximal radius and ulna. No definite elbow joint effusion is seen. The left humeral head remains seated at the glenoid fossa. Calcification overlying the left humeral head may reflect calcific tendinitis. The left acromioclavicular joint is unremarkable. No definite soft abnormalities are characterized on radiograph. IMPRESSION: 1. No evidence of fracture or dislocation. 2. Chronic deformity of the distal humerus, and proximal radius and ulna. 3. Calcification overlying the left humeral head may reflect calcific tendinitis. Electronically Signed   By: Roanna Raider M.D.   On: 08/13/2016 22:19   Dg C-arm 1-60 Min-no Report  Result Date: 08/11/2016 Fluoroscopy was utilized by the requesting physician.  No radiographic interpretation.    Microbiology: Recent Results (from the past  240 hour(s))  Urine culture     Status: Abnormal   Collection Time: 08/11/16  4:30 AM  Result Value Ref Range Status   Specimen Description URINE, RANDOM  Final   Special Requests ADDED [id number]  Final   Culture >=100,000 COLONIES/mL ESCHERICHIA COLI (A)  Final   Report Status 08/13/2016 FINAL  Final   Organism ID, Bacteria ESCHERICHIA COLI (A)  Final      Susceptibility   Escherichia coli - MIC*    AMPICILLIN >=32 RESISTANT Resistant     CEFAZOLIN <=4 SENSITIVE Sensitive     CEFTRIAXONE <=1 SENSITIVE Sensitive     CIPROFLOXACIN <=0.25 SENSITIVE Sensitive     GENTAMICIN <=1 SENSITIVE Sensitive     IMIPENEM <=0.25 SENSITIVE Sensitive     NITROFURANTOIN <=16 SENSITIVE Sensitive     TRIMETH/SULFA <=20 SENSITIVE Sensitive     AMPICILLIN/SULBACTAM 16 INTERMEDIATE Intermediate     PIP/TAZO <=4 SENSITIVE Sensitive     Extended ESBL NEGATIVE Sensitive     * >=100,000 COLONIES/mL ESCHERICHIA COLI  Culture, blood (Routine X 2) w Reflex to ID Panel     Status: None (Preliminary result)   Collection Time: 08/15/16  9:40 AM  Result Value Ref Range Status   Specimen Description BLOOD RIGHT ANTECUBITAL  Final   Special Requests   Final    BOTTLES DRAWN AEROBIC AND ANAEROBIC Blood Culture adequate volume   Culture NO GROWTH 3 DAYS  Final  Report Status PENDING  Incomplete  Culture, blood (Routine X 2) w Reflex to ID Panel     Status: None (Preliminary result)   Collection Time: 08/15/16  9:47 AM  Result Value Ref Range Status   Specimen Description BLOOD LEFT WRIST  Final   Special Requests IN PEDIATRIC BOTTLE Blood Culture adequate volume  Final   Culture NO GROWTH 3 DAYS  Final   Report Status PENDING  Incomplete  Urine culture     Status: None   Collection Time: 08/15/16 12:04 PM  Result Value Ref Range Status   Specimen Description URINE, RANDOM  Final   Special Requests NONE  Final   Culture NO GROWTH  Final   Report Status 08/16/2016 FINAL  Final     Labs: Basic Metabolic  Panel:  Recent Labs Lab 08/14/16 0426 08/15/16 0610 08/16/16 0502 08/17/16 0426 08/18/16 0341  NA 134* 135 130* 134* 135  K 3.6 3.5 3.9 4.2 3.8  CL 105 103 100* 104 104  CO2 20* 23 19* 23 23  GLUCOSE 182* 168* 197* 140* 146*  BUN 33* 27* 31* 31* 30*  CREATININE 1.36* 1.25* 1.27* 1.31* 1.35*  CALCIUM 8.1* 8.5* 8.3* 8.7* 8.5*   Liver Function Tests:  Recent Labs Lab 08/12/16 0531  AST 25  ALT 27  ALKPHOS 73  BILITOT 0.6  PROT 6.2*  ALBUMIN 2.5*   No results for input(s): LIPASE, AMYLASE in the last 168 hours. No results for input(s): AMMONIA in the last 168 hours. CBC:  Recent Labs Lab 08/12/16 0531 08/13/16 0412 08/14/16 0426 08/15/16 0610 08/16/16 0502 08/17/16 0426  WBC 13.9* 14.6* 10.5 11.4* 11.8* 11.4*  NEUTROABS 13.0* 12.7* 8.2* 8.9*  --  8.8*  HGB 11.8* 12.0 10.5* 10.9* 10.7* 11.2*  HCT 35.0* 35.6* 31.2* 32.3* 32.5* 34.2*  MCV 94.1 95.2 94.0 92.0 93.7 94.2  PLT 130* 129* 98* 122* 165 239   Cardiac Enzymes: No results for input(s): CKTOTAL, CKMB, CKMBINDEX, TROPONINI in the last 168 hours. BNP: BNP (last 3 results) No results for input(s): BNP in the last 8760 hours.  ProBNP (last 3 results) No results for input(s): PROBNP in the last 8760 hours.  CBG:  Recent Labs Lab 08/17/16 1245 08/17/16 1702 08/17/16 2201 08/18/16 0739 08/18/16 1207  GLUCAP 368* 105* 195* 141* 192*       Signed:  Gaylin Bulthuis MD.  [facility] 08/18/2016, 3:32 PM

## 2016-08-18 NOTE — Progress Notes (Signed)
Patient given discharge instructions- prescriptions and work notes given.  Patient has no questions.  Patient waiting for husband at this time.

## 2016-08-18 NOTE — Progress Notes (Signed)
Physical Therapy Treatment Patient Details Name: Elizabeth Hendricks MRN: [id number] DOB: 1960/07/03 Today's Date: 08/18/2016    History of Present Illness Pt is a 56 y/o female admitted secondary to L abdominal pain, nausea and vomitting. Pt found to have kidney stones/infection, ureterolithiasis now s/p stent placement on 5/28. PMH including but not limited to DM, HLD, HTN. Of note, pt fell in bathroom during this admission on 5/30, all x-rays were negative for any fxs.    PT Comments    Patient is making good progress with PT.  From a mobility standpoint anticipate patient will be ready for DC home when medically ready.    Follow Up Recommendations  No PT follow up;Supervision/Assistance - 24 hour (initially)     Equipment Recommendations  None recommended by PT    Recommendations for Other Services       Precautions / Restrictions Precautions Precautions: Fall Precaution Comments: fell once since sx due to slipped in her own urine while going to bathroom Restrictions Weight Bearing Restrictions: No    Mobility  Bed Mobility               General bed mobility comments: pt OOB in chair upon arrival  Transfers Overall transfer level: Independent Equipment used: None Transfers: Sit to/from Stand              Ambulation/Gait Ambulation/Gait assistance: Supervision Ambulation Distance (Feet): 300 Feet Assistive device: None Gait Pattern/deviations: Step-through pattern     General Gait Details: steady gait   Stairs Stairs: Yes   Stair Management: One rail Right;Alternating pattern;Step to pattern;Forwards Number of Stairs: 14 General stair comments: cues for step to pattern   Wheelchair Mobility    Modified Rankin (Stroke Patients Only)       Balance Overall balance assessment: Needs assistance;History of Falls Sitting-balance support: No upper extremity supported;Feet supported Sitting balance-Leahy Scale: Good     Standing balance support: No  upper extremity supported Standing balance-Leahy Scale: Good                              Cognition Arousal/Alertness: Awake/alert Behavior During Therapy: WFL for tasks assessed/performed Overall Cognitive Status: Within Functional Limits for tasks assessed                                        Exercises      General Comments        Pertinent Vitals/Pain Pain Assessment: No/denies pain    Home Living                      Prior Function            PT Goals (current goals can now be found in the care plan section) Acute Rehab PT Goals Patient Stated Goal: home soon and back to work Progress towards PT goals: Progressing toward goals    Frequency    Min 3X/week      PT Plan Current plan remains appropriate    Co-evaluation              AM-PAC PT "6 Clicks" Daily Activity  Outcome Measure  Difficulty turning over in bed (including adjusting bedclothes, sheets and blankets)?: None Difficulty moving from lying on back to sitting on the side of the bed? : None Difficulty sitting down on and standing up  from a chair with arms (e.g., wheelchair, bedside commode, etc,.)?: None Help needed moving to and from a bed to chair (including a wheelchair)?: None Help needed walking in hospital room?: None Help needed climbing 3-5 steps with a railing? : None 6 Click Score: 24    End of Session Equipment Utilized During Treatment: Gait belt Activity Tolerance: Patient tolerated treatment well Patient left: with call bell/phone within reach;in chair Nurse Communication: Mobility status PT Visit Diagnosis: Other abnormalities of gait and mobility (R26.89)     Time: [id number] PT Time Calculation (min) (ACUTE ONLY): 13 min  Charges:  $Gait Training: 8-22 mins                    G Codes:       Erline LevineKellyn Dorsey Charette, PTA Pager: [phone]     Carolynne EdouardKellyn R Marajade Lei 08/18/2016, 4:26 PM

## 2016-08-18 NOTE — Progress Notes (Signed)
Pt refused ambien.

## 2016-08-20 LAB — CULTURE, BLOOD (ROUTINE X 2)
CULTURE: NO GROWTH
Culture: NO GROWTH
SPECIAL REQUESTS: ADEQUATE
Special Requests: ADEQUATE

## 2016-08-28 ENCOUNTER — Other Ambulatory Visit: Payer: Self-pay | Admitting: Urology

## 2016-09-02 NOTE — Patient Instructions (Addendum)
Elizabeth Hendricks  09/02/2016   Your procedure is scheduled on: 09-08-16   [facility] Main Entr[facility] Take [facility]  Elevators to 3rd floor to Short Stay Center at 5:30 AM.   Call this number if you have problems the morning of surgery [phone]   Remember: ONLY 1 PERSON MAY GO WITH YOU TO SHORT STAY TO GET  READY MORNING OF YOUR SURGERY.  Do not eat food or drink liquids :After Midnight.     Take these medicines the morning of surgery with A SIP OF WATER: Atenolol (Tenormin), and Levothyroxine (Synthroid)    DO NOT TAKE ANY DIABETIC MEDICATIONS DAY OF YOUR SURGERY                               You may not have any metal on your body including hair pins and              piercings  Do not wear jewelry, make-up, lotions, powders or perfumes, deodorant             Do not wear nail polish.  Do not shave  48 hours prior to surgery.                Do not bring valuables to the hospital. [facility] IS NOT             RESPONSIBLE   FOR VALUABLES.  Contacts, dentures or bridgework may not be worn into surgery.       Patients discharged the day of surgery will not be allowed to drive home.  Name and phone number of your driver: Leonette MostCharles [phone]              Please read over the following fact sheets you were given: _____________________________________________________________________  How to Manage Your Diabetes Before and After Surgery  Why is it important to control my blood sugar before and after surgery? . Improving blood sugar levels before and after surgery helps healing and can limit problems. . A way of improving blood sugar control is eating a healthy diet by: o  Eating less sugar and carbohydrates o  Increasing activity/exercise o  Talking with your doctor about reaching your blood sugar goals . High blood sugars (greater than 180 mg/dL) can raise your risk of infections and slow your recovery, so you will need to focus on controlling your  diabetes during the weeks before surgery. . Make sure that the doctor who takes care of your diabetes knows about your planned surgery including the date and location.  How do I manage my blood sugar before surgery? . Check your blood sugar at least 4 times a day, starting 2 days before surgery, to make sure that the level is not too high or low. o Check your blood sugar the morning of your surgery when you wake up and every 2 hours until you get to the Short Stay unit. . If your blood sugar is less than 70 mg/dL, you will need to treat for low blood sugar: o Do not take insulin. o Treat a low blood sugar (less than 70 mg/dL) with  cup of clear juice (cranberry or apple), 4 glucose tablets, OR glucose gel. o Recheck blood sugar in 15 minutes after treatment (to make sure it is greater than 70 mg/dL). If your blood sugar is not  greater than 70 mg/dL on recheck, call [phone] for further instructions. . Report your blood sugar to the short stay nurse when you get to Short Stay.  . If you are admitted to the hospital after surgery: o Your blood sugar will be checked by the staff and you will probably be given insulin after surgery (instead of oral diabetes medicines) to make sure you have good blood sugar levels. o The goal for blood sugar control after surgery is 80-180 mg/dL.   WHAT DO I DO ABOUT MY DIABETES MEDICATION?  Marland Kitchen Do not take oral diabetes medicines (pills) the morning of surgery.  . THE DAY BEFORE SURGERY, you may take your usual dose of Metformin       Patient Signature:  Date:   Nurse Signature:  Date:   Reviewed and Endorsed by [facility] Patient Education Committee, August 2015           [facility] - Preparing for Surgery Before surgery, you can play an important role.  Because skin is not sterile, your skin needs to be as free of germs as possible.  You can reduce the number of germs on your skin by washing with CHG (chlorahexidine gluconate) soap before surgery.   CHG is an antiseptic cleaner which kills germs and bonds with the skin to continue killing germs even after washing. Please DO NOT use if you have an allergy to CHG or antibacterial soaps.  If your skin becomes reddened/irritated stop using the CHG and inform your nurse when you arrive at Short Stay. Do not shave (including legs and underarms) for at least 48 hours prior to the first CHG shower.  You may shave your face/neck. Please follow these instructions carefully:  1.  Shower with CHG Soap the night before surgery and the  morning of Surgery.  2.  If you choose to wash your hair, wash your hair first as usual with your  normal  shampoo.  3.  After you shampoo, rinse your hair and body thoroughly to remove the  shampoo.                           4.  Use CHG as you would any other liquid soap.  You can apply chg directly  to the skin and wash                       Gently with a scrungie or clean washcloth.  5.  Apply the CHG Soap to your body ONLY FROM THE NECK DOWN.   Do not use on face/ open                           Wound or open sores. Avoid contact with eyes, ears mouth and genitals (private parts).                       Wash face,  Genitals (private parts) with your normal soap.             6.  Wash thoroughly, paying special attention to the area where your surgery  will be performed.  7.  Thoroughly rinse your body with warm water from the neck down.  8.  DO NOT shower/wash with your normal soap after using and rinsing off  the CHG Soap.  9.  Pat yourself dry with a clean towel.            10.  Wear clean pajamas.            11.  Place clean sheets on your bed the night of your first shower and do not  sleep with pets. Day of Surgery : Do not apply any lotions/deodorants the morning of surgery.  Please wear clean clothes to the hospital/surgery center.  FAILURE TO FOLLOW THESE INSTRUCTIONS MAY RESULT IN THE CANCELLATION OF YOUR SURGERY PATIENT  SIGNATURE_________________________________  NURSE SIGNATURE__________________________________  ________________________________________________________________________

## 2016-09-02 NOTE — Progress Notes (Signed)
08-17-16 (EPIC) CBC w/ Diff 08-18-16 (EPIC) BMP 08-12-16 (EPIC) CXR  06-18-16 office note on chart

## 2016-09-04 ENCOUNTER — Ambulatory Visit (HOSPITAL_COMMUNITY)
Admission: RE | Admit: 2016-09-04 | Discharge: 2016-09-04 | Disposition: A | Discharge status: Home / Self Care | Payer: BLUE CROSS/BLUE SHIELD | Source: Ambulatory Visit | Attending: Anesthesiology | Admitting: Anesthesiology

## 2016-09-04 ENCOUNTER — Encounter (HOSPITAL_COMMUNITY): Payer: Self-pay

## 2016-09-04 ENCOUNTER — Encounter (HOSPITAL_COMMUNITY)
Admission: RE | Admit: 2016-09-04 | Discharge: 2016-09-04 | Disposition: A | Discharge status: Home / Self Care | Payer: BLUE CROSS/BLUE SHIELD | Source: Ambulatory Visit | Attending: Urology | Admitting: Urology

## 2016-09-04 DIAGNOSIS — R9389 Abnormal findings on diagnostic imaging of other specified body structures: Secondary | ICD-10-CM

## 2016-09-04 DIAGNOSIS — Z01818 Encounter for other preprocedural examination: Secondary | ICD-10-CM | POA: Insufficient documentation

## 2016-09-04 DIAGNOSIS — N201 Calculus of ureter: Secondary | ICD-10-CM | POA: Diagnosis not present

## 2016-09-04 DIAGNOSIS — I10 Essential (primary) hypertension: Secondary | ICD-10-CM | POA: Diagnosis not present

## 2016-09-04 DIAGNOSIS — R9431 Abnormal electrocardiogram [ECG] [EKG]: Secondary | ICD-10-CM | POA: Diagnosis not present

## 2016-09-04 DIAGNOSIS — Z01810 Encounter for preprocedural cardiovascular examination: Secondary | ICD-10-CM | POA: Diagnosis not present

## 2016-09-04 DIAGNOSIS — E119 Type 2 diabetes mellitus without complications: Secondary | ICD-10-CM | POA: Diagnosis not present

## 2016-09-04 DIAGNOSIS — Z01812 Encounter for preprocedural laboratory examination: Secondary | ICD-10-CM | POA: Diagnosis present

## 2016-09-04 LAB — BASIC METABOLIC PANEL
ANION GAP: 11 (ref 5–15)
BUN: 43 mg/dL — AB (ref 6–20)
CALCIUM: 9 mg/dL (ref 8.9–10.3)
CO2: 19 mmol/L — AB (ref 22–32)
Chloride: 108 mmol/L (ref 101–111)
Creatinine, Ser: 1.77 mg/dL — ABNORMAL HIGH (ref 0.44–1.00)
GFR calc Af Amer: 36 mL/min — ABNORMAL LOW (ref >60)
GFR calc non Af Amer: 31 mL/min — ABNORMAL LOW (ref >60)
GLUCOSE: 180 mg/dL — AB (ref 65–99)
POTASSIUM: 4.6 mmol/L (ref 3.5–5.1)
Sodium: 138 mmol/L (ref 135–145)

## 2016-09-04 LAB — CBC
HEMATOCRIT: 28.3 % — AB (ref 36.0–46.0)
HEMOGLOBIN: 9.5 g/dL — AB (ref 12.0–15.0)
MCH: 31.3 pg (ref 26.0–34.0)
MCHC: 33.6 g/dL (ref 30.0–36.0)
MCV: 93.1 fL (ref 78.0–100.0)
Platelets: 331 10*3/uL (ref 150–400)
RBC: 3.04 MIL/uL — ABNORMAL LOW (ref 3.87–5.11)
RDW: 13.3 % (ref 11.5–15.5)
WBC: 9.4 10*3/uL (ref 4.0–10.5)

## 2016-09-04 LAB — GLUCOSE, CAPILLARY: Glucose-Capillary: 183 mg/dL — ABNORMAL HIGH (ref 65–99)

## 2016-09-04 NOTE — Progress Notes (Signed)
09-04-16 CBC and BMP results routed to Dr. Retta Dionesahlstedt for review.

## 2016-09-05 LAB — HEMOGLOBIN A1C
Hgb A1c MFr Bld: 7.8 % — ABNORMAL HIGH (ref 4.8–5.6)
Mean Plasma Glucose: 177 mg/dL

## 2016-09-07 NOTE — Anesthesia Preprocedure Evaluation (Addendum)
Anesthesia Evaluation  Patient identified by MRN, date of birth, ID band Patient awake    Reviewed: Allergy & Precautions, H&P , Patient's Chart, lab work & pertinent test results, reviewed documented beta blocker date and time   Airway Mallampati: II  TM Distance: >3 FB Neck ROM: full    Dental no notable dental hx.    Pulmonary    Pulmonary exam normal breath sounds clear to auscultation       Cardiovascular hypertension, On Medications  Rhythm:regular Rate:Normal     Neuro/Psych    GI/Hepatic   Endo/Other  diabetes, Type 2  Renal/GU      Musculoskeletal   Abdominal   Peds  Hematology   Anesthesia Other Findings   Reproductive/Obstetrics                             Anesthesia Physical Anesthesia Plan  ASA: II  Anesthesia Plan: General   Post-op Pain Management:    Induction: Intravenous  PONV Risk Score and Plan:   Airway Management Planned: Oral ETT  Additional Equipment:   Intra-op Plan:   Post-operative Plan: Extubation in OR  Informed Consent: I have reviewed the patients History and Physical, chart, labs and discussed the procedure including the risks, benefits and alternatives for the proposed anesthesia with the patient or authorized representative who has indicated his/her understanding and acceptance.   Dental Advisory Given  Plan Discussed with: CRNA and Surgeon  Anesthesia Plan Comments: (  )        Anesthesia Quick Evaluation

## 2016-09-08 ENCOUNTER — Ambulatory Visit (HOSPITAL_COMMUNITY): Payer: BLUE CROSS/BLUE SHIELD

## 2016-09-08 ENCOUNTER — Ambulatory Visit (HOSPITAL_COMMUNITY): Payer: BLUE CROSS/BLUE SHIELD | Admitting: Anesthesiology

## 2016-09-08 ENCOUNTER — Encounter (HOSPITAL_COMMUNITY): Payer: Self-pay | Admitting: *Deleted

## 2016-09-08 ENCOUNTER — Ambulatory Visit (HOSPITAL_COMMUNITY)
Admission: RE | Admit: 2016-09-08 | Discharge: 2016-09-08 | Disposition: A | Discharge status: Home / Self Care | Payer: BLUE CROSS/BLUE SHIELD | Source: Ambulatory Visit | Attending: Urology | Admitting: Urology

## 2016-09-08 ENCOUNTER — Encounter (HOSPITAL_COMMUNITY)
Admission: RE | Disposition: A | Discharge status: Home / Self Care | Payer: Self-pay | Source: Ambulatory Visit | Attending: Urology

## 2016-09-08 DIAGNOSIS — Z87442 Personal history of urinary calculi: Secondary | ICD-10-CM | POA: Diagnosis not present

## 2016-09-08 DIAGNOSIS — N201 Calculus of ureter: Secondary | ICD-10-CM | POA: Insufficient documentation

## 2016-09-08 DIAGNOSIS — E119 Type 2 diabetes mellitus without complications: Secondary | ICD-10-CM | POA: Insufficient documentation

## 2016-09-08 DIAGNOSIS — Z7984 Long term (current) use of oral hypoglycemic drugs: Secondary | ICD-10-CM | POA: Diagnosis not present

## 2016-09-08 DIAGNOSIS — I10 Essential (primary) hypertension: Secondary | ICD-10-CM | POA: Insufficient documentation

## 2016-09-08 DIAGNOSIS — N202 Calculus of kidney with calculus of ureter: Secondary | ICD-10-CM | POA: Diagnosis present

## 2016-09-08 DIAGNOSIS — Z79899 Other long term (current) drug therapy: Secondary | ICD-10-CM | POA: Insufficient documentation

## 2016-09-08 DIAGNOSIS — E785 Hyperlipidemia, unspecified: Secondary | ICD-10-CM | POA: Diagnosis not present

## 2016-09-08 DIAGNOSIS — Z7982 Long term (current) use of aspirin: Secondary | ICD-10-CM | POA: Insufficient documentation

## 2016-09-08 HISTORY — PX: CYSTOSCOPY/RETROGRADE/URETEROSCOPY/STONE EXTRACTION WITH BASKET: SHX5317

## 2016-09-08 LAB — GLUCOSE, CAPILLARY
GLUCOSE-CAPILLARY: 202 mg/dL — AB (ref 65–99)
GLUCOSE-CAPILLARY: 196 mg/dL — AB (ref 65–99)

## 2016-09-08 SURGERY — CYSTOSCOPY, WITH CALCULUS REMOVAL USING BASKET
Anesthesia: General | Laterality: Left

## 2016-09-08 MED ORDER — LIDOCAINE HCL (CARDIAC) 20 MG/ML IV SOLN
INTRAVENOUS | Status: DC | PRN
  Administered 2016-09-08: 50 mg via INTRAVENOUS

## 2016-09-08 MED ORDER — FENTANYL CITRATE (PF) 100 MCG/2ML IJ SOLN
INTRAMUSCULAR | Status: AC
  Filled 2016-09-08: qty 2

## 2016-09-08 MED ORDER — CEFAZOLIN SODIUM-DEXTROSE 2-4 GM/100ML-% IV SOLN
INTRAVENOUS | Status: AC
  Filled 2016-09-08: qty 100

## 2016-09-08 MED ORDER — FENTANYL CITRATE (PF) 100 MCG/2ML IJ SOLN
25.0000 ug | INTRAMUSCULAR | Status: DC | PRN
  Administered 2016-09-08 (×2): 50 ug via INTRAVENOUS

## 2016-09-08 MED ORDER — MIDAZOLAM HCL 5 MG/5ML IJ SOLN
INTRAMUSCULAR | Status: DC | PRN
  Administered 2016-09-08: 2 mg via INTRAVENOUS

## 2016-09-08 MED ORDER — IOHEXOL 300 MG/ML  SOLN
INTRAMUSCULAR | Status: DC | PRN
Start: 2016-09-08 — End: 2016-09-08
  Administered 2016-09-08: 10 mL via URETHRAL

## 2016-09-08 MED ORDER — SUGAMMADEX SODIUM 200 MG/2ML IV SOLN
INTRAVENOUS | Status: AC
  Filled 2016-09-08: qty 2

## 2016-09-08 MED ORDER — FLUCONAZOLE 100 MG PO TABS: 100.0000 mg | ORAL_TABLET | Freq: Every day | ORAL | 0 refills | Status: DC

## 2016-09-08 MED ORDER — ROCURONIUM BROMIDE 50 MG/5ML IV SOSY
PREFILLED_SYRINGE | INTRAVENOUS | Status: AC
  Filled 2016-09-08: qty 5

## 2016-09-08 MED ORDER — FLUCONAZOLE 100 MG PO TABS: 100.0000 mg | ORAL_TABLET | Freq: Every day | ORAL | 0 refills | Status: AC

## 2016-09-08 MED ORDER — LACTATED RINGERS IV SOLN
INTRAVENOUS | Status: DC
  Administered 2016-09-08: 09:00:00 via INTRAVENOUS

## 2016-09-08 MED ORDER — PROPOFOL 10 MG/ML IV BOLUS
INTRAVENOUS | Status: AC
  Filled 2016-09-08: qty 20

## 2016-09-08 MED ORDER — OXYBUTYNIN CHLORIDE 5 MG PO TABS: 5.0000 mg | ORAL_TABLET | Freq: Three times a day (TID) | ORAL | 1 refills | Status: AC | PRN

## 2016-09-08 MED ORDER — SODIUM CHLORIDE 0.9 % IR SOLN
Status: DC | PRN
  Administered 2016-09-08: 4000 mL

## 2016-09-08 MED ORDER — ROCURONIUM BROMIDE 100 MG/10ML IV SOLN
INTRAVENOUS | Status: DC | PRN
  Administered 2016-09-08: 35 mg via INTRAVENOUS

## 2016-09-08 MED ORDER — PROPOFOL 10 MG/ML IV BOLUS
INTRAVENOUS | Status: DC | PRN
  Administered 2016-09-08: 120 mg via INTRAVENOUS

## 2016-09-08 MED ORDER — DEXAMETHASONE SODIUM PHOSPHATE 10 MG/ML IJ SOLN
INTRAMUSCULAR | Status: DC | PRN
  Administered 2016-09-08: 4 mg via INTRAVENOUS

## 2016-09-08 MED ORDER — LIDOCAINE 2% (20 MG/ML) 5 ML SYRINGE
INTRAMUSCULAR | Status: AC
  Filled 2016-09-08: qty 5

## 2016-09-08 MED ORDER — CEFAZOLIN SODIUM-DEXTROSE 2-4 GM/100ML-% IV SOLN
2.0000 g | INTRAVENOUS | Status: AC
  Administered 2016-09-08: 2 g via INTRAVENOUS

## 2016-09-08 MED ORDER — LACTATED RINGERS IV SOLN
INTRAVENOUS | Status: DC | PRN
  Administered 2016-09-08: 07:00:00 via INTRAVENOUS

## 2016-09-08 MED ORDER — SULFAMETHOXAZOLE-TRIMETHOPRIM 800-160 MG PO TABS: 1.0000 | ORAL_TABLET | Freq: Two times a day (BID) | ORAL | 0 refills | Status: AC

## 2016-09-08 MED ORDER — FENTANYL CITRATE (PF) 100 MCG/2ML IJ SOLN
INTRAMUSCULAR | Status: DC | PRN
  Administered 2016-09-08: 25 ug via INTRAVENOUS
  Administered 2016-09-08: 50 ug via INTRAVENOUS
  Administered 2016-09-08: 25 ug via INTRAVENOUS

## 2016-09-08 MED ORDER — ONDANSETRON HCL 4 MG/2ML IJ SOLN
INTRAMUSCULAR | Status: AC
  Filled 2016-09-08: qty 2

## 2016-09-08 MED ORDER — SUGAMMADEX SODIUM 200 MG/2ML IV SOLN
INTRAVENOUS | Status: DC | PRN
  Administered 2016-09-08: 130 mg via INTRAVENOUS

## 2016-09-08 MED ORDER — DEXAMETHASONE SODIUM PHOSPHATE 10 MG/ML IJ SOLN
INTRAMUSCULAR | Status: AC
  Filled 2016-09-08: qty 1

## 2016-09-08 MED ORDER — MIDAZOLAM HCL 2 MG/2ML IJ SOLN
INTRAMUSCULAR | Status: AC
Start: 2016-09-08 — End: 2016-09-08
  Filled 2016-09-08: qty 2

## 2016-09-08 SURGICAL SUPPLY — 24 items
BAG URO CATCHER STRL LF (MISCELLANEOUS) ×2 IMPLANT
BASKET LASER NITINOL 1.9FR (BASKET) ×2 IMPLANT
BASKET ZERO TIP NITINOL 2.4FR (BASKET) IMPLANT
BSKT STON RTRVL 120 1.9FR (BASKET) ×1
BSKT STON RTRVL ZERO TP 2.4FR (BASKET)
CATH INTERMIT  6FR 70CM (CATHETERS) IMPLANT
CLOTH BEACON ORANGE TIMEOUT ST (SAFETY) ×2 IMPLANT
COVER SURGICAL LIGHT HANDLE (MISCELLANEOUS) ×2 IMPLANT
EXTRACTOR STONE NITINOL NGAGE (UROLOGICAL SUPPLIES) ×1 IMPLANT
FIBER LASER FLEXIVA 365 (UROLOGICAL SUPPLIES) IMPLANT
FIBER LASER TRAC TIP (UROLOGICAL SUPPLIES) ×1 IMPLANT
GLOVE BIOGEL M 8.0 STRL (GLOVE) ×2 IMPLANT
GOWN STRL REUS W/ TWL XL LVL3 (GOWN DISPOSABLE) ×1 IMPLANT
GOWN STRL REUS W/TWL LRG LVL3 (GOWN DISPOSABLE) ×4 IMPLANT
GOWN STRL REUS W/TWL XL LVL3 (GOWN DISPOSABLE) ×2
GUIDEWIRE ANG ZIPWIRE 038X150 (WIRE) ×2 IMPLANT
GUIDEWIRE STR DUAL SENSOR (WIRE) ×2 IMPLANT
IV NS 1000ML (IV SOLUTION) ×2
IV NS 1000ML BAXH (IV SOLUTION) ×1 IMPLANT
MANIFOLD NEPTUNE II (INSTRUMENTS) ×2 IMPLANT
PACK CYSTO (CUSTOM PROCEDURE TRAY) ×2 IMPLANT
SHEATH ACCESS URETERAL 38CM (SHEATH) ×1 IMPLANT
STENT URET 6FRX24 CONTOUR (STENTS) ×1 IMPLANT
TUBING CONNECTING 10 (TUBING) ×2 IMPLANT

## 2016-09-08 NOTE — Op Note (Signed)
Preoperative diagnosis: Left distal ureteral stone, large, left renal calculi  Postoperative diagnosis: Left distal ureteral stone, no evidence of renal calculi  Principal procedure: Cystoscopy, left double-J stent extraction, left retrograde ureteropyelogram with fluoroscopic interpretation, left ureteroscopy (rigid and flexible), holmium laser lithotripsy and extraction of left distal ureteral stone, replacement of 24 centimeter by 6 French contour double-J stent with string  Surgeon: Presten Joost  Anesthesia: Gen. endotracheal  Complications: None  Specimen: Urine sent for culture, stone fragments  Estimated blood loss: None  Indications: 56 year old female 1 month out from urgent stenting for an obstructing, infected left ureteral stone.  Her urine grew Escherichia coli.  During the hospitalization for which she was treated for sepsis.  She followed up in the office within the past 2 weeks for planning of eventual stone management area.  Urine culture at that time grew yeast, which was adequately treated with Diflucan.  She presents at this time for definitive ureteroscopic management of a large distal ureteral stone.  There was radiographic evidence of some smaller left renal calculi on the CT scan which was performed during her hospitalization.  The procedure as well as risks and complications including infection, need for stent replacement, anesthetic complications, among others have been discussed with her.  She understands these and desired to proceed.  Findings: Bladder revealed no urothelial abnormalities except for mild erythema around her left ureteral orifice secondary to indwelling stent.  Encrustation of stent was noted.  Ureteropyelogram revealed a filling defect in the left distal ureter consistent with stones.  No other ureteral or renal abnormalities were noted.  Description of procedure: The patient was properly identified and marked in the holding area.  She received  preoperative IV antibiotics.  Speech was taken to the operating room where general anesthetic was administered.  She was placed in the dorsolithotomy position.  Genitalia and perineum were prepped and draped in proper timeout was performed.  The scope was placed within the bladder.  Urine was sent for culture.  No obvious abnormalities were noted within the bladder.   the stent was grasped and extracted through the urethral meatus.  A guidewire was easily advanced through the stent and up in the left upper pole calyceal system, noted by fluoroscopic guidance.  The stent was then removed.  I then passed a 6 [place] open-ended catheter, and gentle retrograde ureteropyelogram was performed.  There was reflux around the open-ended catheter into the bladder, but the distal and mid ureter filled with the contrast.  This revealed a filling defect in the left distal ureter consistent with fairly large stone.  No other ureteral abnormalities were noted.  The open-ended catheter was removed.  At this point, a semirigid, 6 French dual-lumen ureteroscope was advanced into the bladder, up to the left ureter where a large stone was encountered.  It was quite pale, consistent with possible uric acid stone.  It was too large to be grasped and extracted.  Using a 200 micron fiber, I applied laser energy using the holmium laser with settings of 15 hertz and 0.5 joules.  The stone was fragmented into multiple small fragments which were then easily grasped and drop into the bladder.  Inspection of the distal ureter with the scope revealed no further ureteral stones.  The ureteroscope was then removed.  I then placed a 12/14 ureteral access catheter quite easily into the mid/upper ureter.  Over top of the guidewire.  Once in place, the guidewire was removed.  The flexible dual-lumen digital ureteroscope was advanced  through the access sheath and into the pyelocalyceal system.  Careful inspection of the entire pyelocalyceal system first  from the upper pole calyces down to the lower pole calyces, sequentially, and then back up, revealed no stones seen within the calyceal system.  The renal pelvis was carefully inspected as well.  No stones were seen.  It had appeared on the prior CT scan, that there were renal calculi.  However, none were seen.  Following this, careful inspection, the ureteroscope was removed.  The 0.038 inch sensor-tip guidewire was then replaced through the access catheter into the upper pole calyceal system.  The access catheter was removed over top of this.  I then placed a cystoscope, and drained.  The fragments from the bladder area.  These were saved for specimen/eventual analysis.  The scope was then removed, the guidewire was backloaded through the scope, and using fluoroscopic and cystoscopic guidance, a 24 centimeter by 6 [place] contour double-J stent was placed in the ureter, with excellent proximal and distally curl seen following removal of the guidewire.  The bladder was drained.  The thread was brought up through the urethra.  It was tied high up outside of the urethra, and then trimmed and tucked within the vagina.  At this point, the procedure was terminated.  The patient was then awakened and taken to the PACU in stable condition.

## 2016-09-08 NOTE — Anesthesia Postprocedure Evaluation (Signed)
Anesthesia Post Note  Patient: Elizabeth Hendricks  Procedure(s) Performed: Procedure(s) (LRB): CYSTOSCOPY/RETROGRADE/URETEROSCOPY/ STENT REMOVAL/HOLMIUM LASER/STONE EXTRACTION/ STENT EXCHANGE (Left)     Patient location during evaluation: PACU Anesthesia Type: General Level of consciousness: awake and alert Pain management: pain level controlled Vital Signs Assessment: post-procedure vital signs reviewed and stable Respiratory status: spontaneous breathing, nonlabored ventilation, respiratory function stable and patient connected to nasal cannula oxygen Cardiovascular status: blood pressure returned to baseline and stable Postop Assessment: no signs of nausea or vomiting Anesthetic complications: no    Last Vitals:  Vitals:   09/08/16 1003 09/08/16 1044  BP: 116/87 130/76  Pulse: 85 85  Resp: 16 18  Temp: 37 C 36.5 C    Last Pain:  Vitals:   09/08/16 1044  TempSrc: Oral  PainSc: 1                  Elizabeth Hendricks

## 2016-09-08 NOTE — Discharge Instructions (Signed)
General Anesthesia, Adult, Care After These instructions provide you with information about caring for yourself after your procedure. Your health care provider may also give you more specific instructions. Your treatment has been planned according to current medical practices, but problems sometimes occur. Call your health care provider if you have any problems or questions after your procedure. What can I expect after the procedure? After the procedure, it is common to have: Vomiting. A sore throat. Mental slowness.  It is common to feel: Nauseous. Cold or shivery. Sleepy. Tired. Sore or achy, even in parts of your body where you did not have surgery.  Follow these instructions at home: For at least 24 hours after the procedure: Do not: Participate in activities where you could fall or become injured. Drive. Use heavy machinery. Drink alcohol. Take sleeping pills or medicines that cause drowsiness. Make important decisions or sign legal documents. Take care of children on your own. Rest. Eating and drinking If you vomit, drink water, juice, or soup when you can drink without vomiting. Drink enough fluid to keep your urine clear or pale yellow. Make sure you have little or no nausea before eating solid foods. Follow the diet recommended by your health care provider. General instructions Have a responsible adult stay with you until you are awake and alert. Return to your normal activities as told by your health care provider. Ask your health care provider what activities are safe for you. Take over-the-counter and prescription medicines only as told by your health care provider. If you smoke, do not smoke without supervision. Keep all follow-up visits as told by your health care provider. This is important. Contact a health care provider if: You continue to have nausea or vomiting at home, and medicines are not helpful. You cannot drink fluids or start eating again. You cannot  urinate after 8-12 hours. You develop a skin rash. You have fever. You have increasing redness at the site of your procedure. Get help right away if: You have difficulty breathing. You have chest pain. You have unexpected bleeding. You feel that you are having a life-threatening or urgent problem. This information is not intended to replace advice given to you by your health care provider. Make sure you discuss any questions you have with your health care provider. Document Released: 06/09/2000 Document Revised: 08/06/2015 Document Reviewed: 02/15/2015 Elsevier Interactive Patient Education  2018 [employer] 1. You may see some blood in the urine and may have some burning with urination for 48-72 hours. You also may notice that you have to urinate more frequently or urgently after your procedure which is normal.  2. You should call should you develop an inability urinate, fever > 101, persistent nausea and vomiting that prevents you from eating or drinking to stay hydrated.  3. If you have a stent, you will likely urinate more frequently and urgently until the stent is removed and you may experience some discomfort/pain in the lower abdomen and flank especially when urinating. You may take pain medication prescribed to you if needed for pain. You may also intermittently have blood in the urine until the stent is removed.  It is okay to pull on the small thread that is tucked within the vagina to remove the stent on Thursday morning.  If you're not experiencing difficulties. 4. If you have a catheter, you will be taught how to take care of the catheter by the nursing staff prior to discharge from the hospital.  You may periodically feel a strong  urge to void with the catheter in place.  This is a bladder spasm and most often can occur when having a bowel movement or moving around. It is typically self-limited and usually will stop after a few minutes.  You may use some Vaseline or Neosporin around  the tip of the catheter to reduce friction at the tip of the penis. You may also see some blood in the urine.  A very small amount of blood can make the urine look quite red.  As long as the catheter is draining well, there usually is not a problem.  However, if the catheter is not draining well and is bloody, you should call the office [phone]) to notify us.

## 2016-09-08 NOTE — Interval H&P Note (Signed)
History and Physical Interval Note:  09/08/2016 7:23 AM  Burlene L Geanie Loganerenzi  has presented today for surgery, with the diagnosis of LEFT URETERAL CALCULUS  The various methods of treatment have been discussed with the patient and family. After consideration of risks, benefits and other options for treatment, the patient has consented to  Procedure(s): CYSTOSCOPY/RETROGRADE/URETEROSCOPY/ STENT REMOVAL/HOLMIUM LASER/STONE EXTRACTION (Left) as a surgical intervention .  The patient's history has been reviewed, patient examined, no change in status, stable for surgery.  I have reviewed the patient's chart and labs.  Questions were answered to the patient's satisfaction.     Chelsea AusDAHLSTEDT, Evadne Ose M

## 2016-09-08 NOTE — H&P (Signed)
H&P  Chief Complaint: Kidney stone  History of Present Illness: Elizabeth Hendricks is a 56 y.o. year old female who presents at this time for definitive mgmt of a left ureteral stone. She underwent urgent stenting on 08/11/16 for a ureteral stone presenting with sepsis. She grew e. Coli and was treated as an inpatient with IV antibiotics. She has improved and was recently seen in the office and had a negative culture.  Past Medical History:  Diagnosis Date  . Diabetes mellitus without complication (HCC)   . History of kidney stones 07/2016  . HLD (hyperlipidemia)   . Hypertension   . Thyroid disease   . UTI (urinary tract infection) 07/2016    Past Surgical History:  Procedure Laterality Date  . CYSTOSCOPY  08/11/2016   Cystoscopy, left retrograde pyelogram, fluoroscopic interpretation, placement of double-J stent and left ureter (24 centimeter by 6 French contour without tether)    . CYSTOSCOPY W/ URETERAL STENT PLACEMENT Left 08/11/2016   Procedure: CYSTOSCOPY WITH RETROGRADE PYELOGRAM/URETERAL STENT PLACEMENT;  Surgeon: Marcine Matarahlstedt, Delorean Knutzen, MD;  [facility];  Service: Urology;  Laterality: Left;  . TONSILLECTOMY    . TYMPANOSTOMY TUBE PLACEMENT    . URETHRA SURGERY     performed when pt was a child.     Home Medications:  Medications Prior to Admission  Medication Sig Dispense Refill  . aspirin EC 81 MG tablet Take 81 mg by mouth daily.    Marland Kitchen. atenolol (TENORMIN) 50 MG tablet Take 50 mg by mouth daily.     . Coenzyme Q10 100 MG TABS Take 100 mg by mouth daily.     Marland Kitchen. ibuprofen (ADVIL,MOTRIN) 200 MG tablet Take 200-800 mg by mouth every 6 (six) hours as needed for moderate pain.    Marland Kitchen. levothyroxine (SYNTHROID, LEVOTHROID) 125 MCG tablet Take 125 mcg by mouth daily before breakfast.    . losartan-hydrochlorothiazide (HYZAAR) 100-12.5 MG tablet Take 1 tablet by mouth daily.    . metFORMIN (GLUCOPHAGE) 1000 MG tablet Take 1,000 mg by mouth 2 (two) times daily with a meal.    . Omega-3  Fatty Acids (FISH OIL) 1000 MG CAPS Take 1 capsule by mouth daily.    . simvastatin (ZOCOR) 20 MG tablet Take 20 mg by mouth every evening.       Allergies: No Known Allergies  Family History  Problem Relation Age of Onset  . Stroke Neg Hx   . Heart attack Neg Hx   . Diabetes Neg Hx   . Hypertension Neg Hx     Social History:  reports that she has never smoked. She has never used smokeless tobacco. She reports that she does not drink alcohol or use drugs.  ROS: A complete review of systems was performed.  All systems are negative except for pertinent findings as noted.  Physical Exam:  Vital signs in last 24 hours: Temp:  [99.4 F (37.4 C)] 99.4 F (37.4 C) (06/25 0532) Pulse Rate:  [90] 90 (06/25 0532) Resp:  [18] 18 (06/25 0532) BP: (116)/(77) 116/77 (06/25 0532) SpO2:  [99 %] 99 % (06/25 0532) Weight:  [64.9 kg (143 lb)] 64.9 kg (143 lb) (06/25 0550) General:  Alert and oriented, No acute distress HEENT: Normocephalic, atraumatic Neck: No JVD or lymphadenopathy Cardiovascular: Regular rate and rhythm Lungs: Clear bilaterally Abdomen: Soft, nontender, nondistended, no abdominal masses Back: No CVA tenderness Extremities: No edema Neurologic: Grossly intact  Laboratory Data:  Results for orders placed or performed during the hospital encounter of 09/08/16 (  from the past 24 hour(s))  Glucose, capillary     Status: Abnormal   Collection Time: 09/08/16  5:53 AM  Result Value Ref Range   Glucose-Capillary 202 (H) 65 - 99 mg/dL   No results found for this or any previous visit (from the past 240 hour(s)). Creatinine:  Recent Labs  09/04/16 0851  CREATININE 1.77*    Radiologic Imaging: No results found.  Impression/Assessment:  Left ureteral stone with h/o pyonephrosis, s/p antibiotic and stent mgmt.  Plan:  Cysto, left J2 stent extraction, URS with possible holmium laser litho, stone extraction, possible stent replacement  Chelsea Aus 09/08/2016,  6:32 AM  Bertram Millard. Maxx Pham MD

## 2016-09-08 NOTE — Transfer of Care (Signed)
Immediate Anesthesia Transfer of Care Note  Patient: Elizabeth Hendricks  Procedure(s) Performed: Procedure(s): CYSTOSCOPY/RETROGRADE/URETEROSCOPY/ STENT REMOVAL/HOLMIUM LASER/STONE EXTRACTION/ STENT EXCHANGE (Left)  Patient Location: PACU  Anesthesia Type:General  Level of Consciousness: awake, alert  and oriented  Airway & Oxygen Therapy: Patient Spontanous Breathing and Patient connected to face mask oxygen  Post-op Assessment: Report given to RN and Post -op Vital signs reviewed and stable  Post vital signs: Reviewed and stable  Last Vitals:  Vitals:   09/08/16 0532  BP: 116/77  Pulse: 90  Resp: 18  Temp: 37.4 C    Last Pain:  Vitals:   09/08/16 0532  TempSrc: Oral      Patients Stated Pain Goal: 4 (09/08/16 0550)  Complications: No apparent anesthesia complications

## 2016-09-08 NOTE — Anesthesia Procedure Notes (Signed)
Procedure Name: Intubation Date/Time: 09/08/2016 7:38 AM Performed by: Thornell MuleSTUBBLEFIELD, Zylie Mumaw G Pre-anesthesia Checklist: Patient identified, Emergency Drugs available, Suction available and Patient being monitored Patient Re-evaluated:Patient Re-evaluated prior to inductionOxygen Delivery Method: Circle system utilized Preoxygenation: Pre-oxygenation with 100% oxygen Intubation Type: IV induction Ventilation: Mask ventilation without difficulty Laryngoscope Size: Miller and 3 Grade View: Grade I Tube type: Oral Tube size: 7.0 mm Number of attempts: 1 Airway Equipment and Method: Stylet and Oral airway Placement Confirmation: ETT inserted through vocal cords under direct vision,  positive ETCO2 and breath sounds checked- equal and bilateral Secured at: 20 cm Tube secured with: Tape Dental Injury: Teeth and Oropharynx as per pre-operative assessment

## 2016-09-10 LAB — URINE CULTURE: Culture: >=100000 — AB

## 2018-11-10 IMAGING — DX DG CHEST 2V
2 series · 2 of 2 positions shown · non-contrast
Comparison: CTA of the chest performed 12/16/2009

CLINICAL DATA: Acute onset of shortness of breath and generalized
chest pain. Initial encounter.

EXAM:
CHEST  2 VIEW

[chest pa]
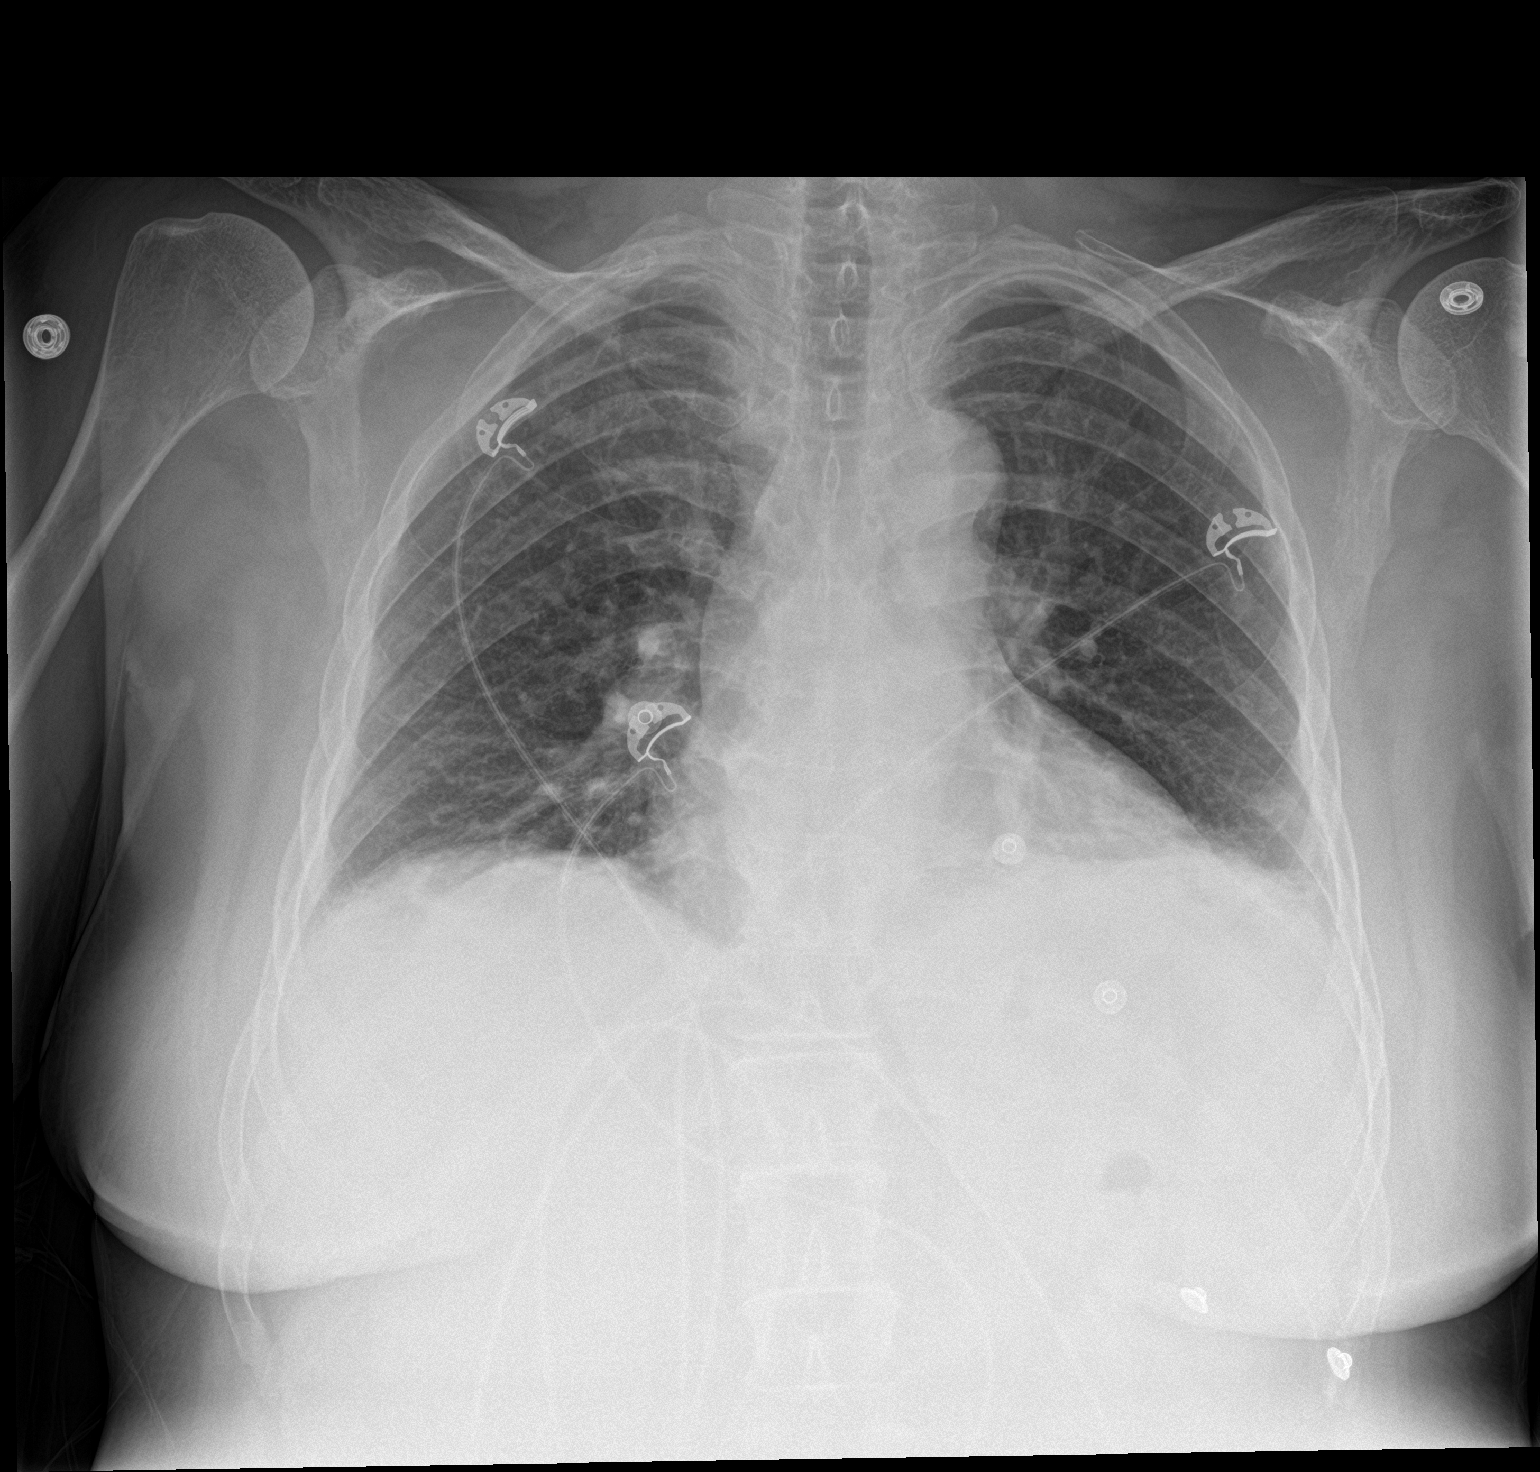

[chest lat]
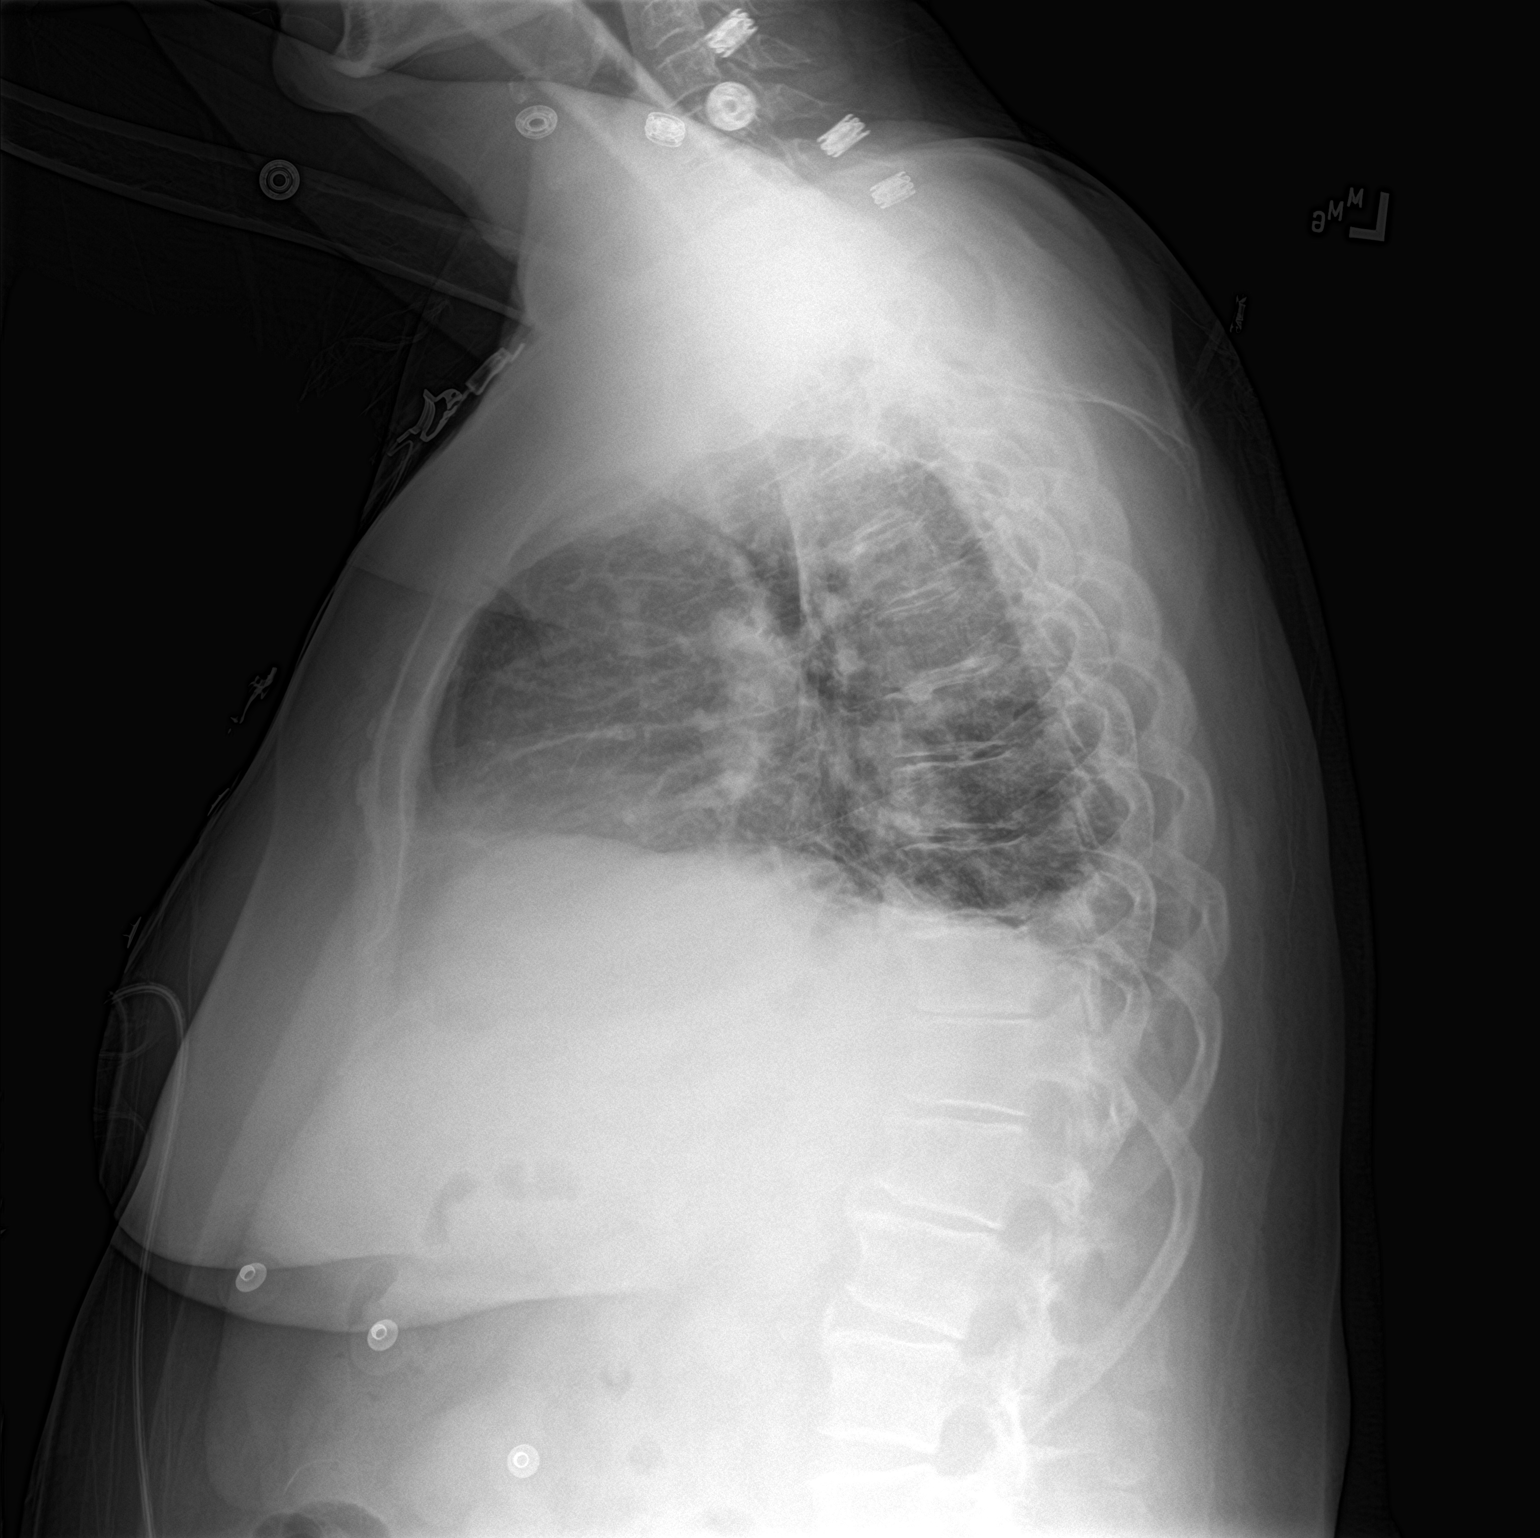

[2 of 2 positions shown; findings below may reference images not displayed]

FINDINGS: The lungs are well-aerated. Mild left basilar airspace opacity may
reflect atelectasis or possibly mild pneumonia. There is no evidence
of pleural effusion or pneumothorax.

The heart is borderline normal in size. No acute osseous
abnormalities are seen.
IMPRESSION: Mild left basilar airspace opacity may reflect atelectasis or
possibly mild pneumonia.

## 2018-11-12 IMAGING — CR DG HUMERUS 2V *L*
2 series · 2 of 2 positions shown · non-contrast
Comparison: None.

CLINICAL DATA: Status post fall in bathroom, with left distal arm
pain. Initial encounter.

EXAM:
LEFT HUMERUS - 2+ VIEW

[humerus ap]
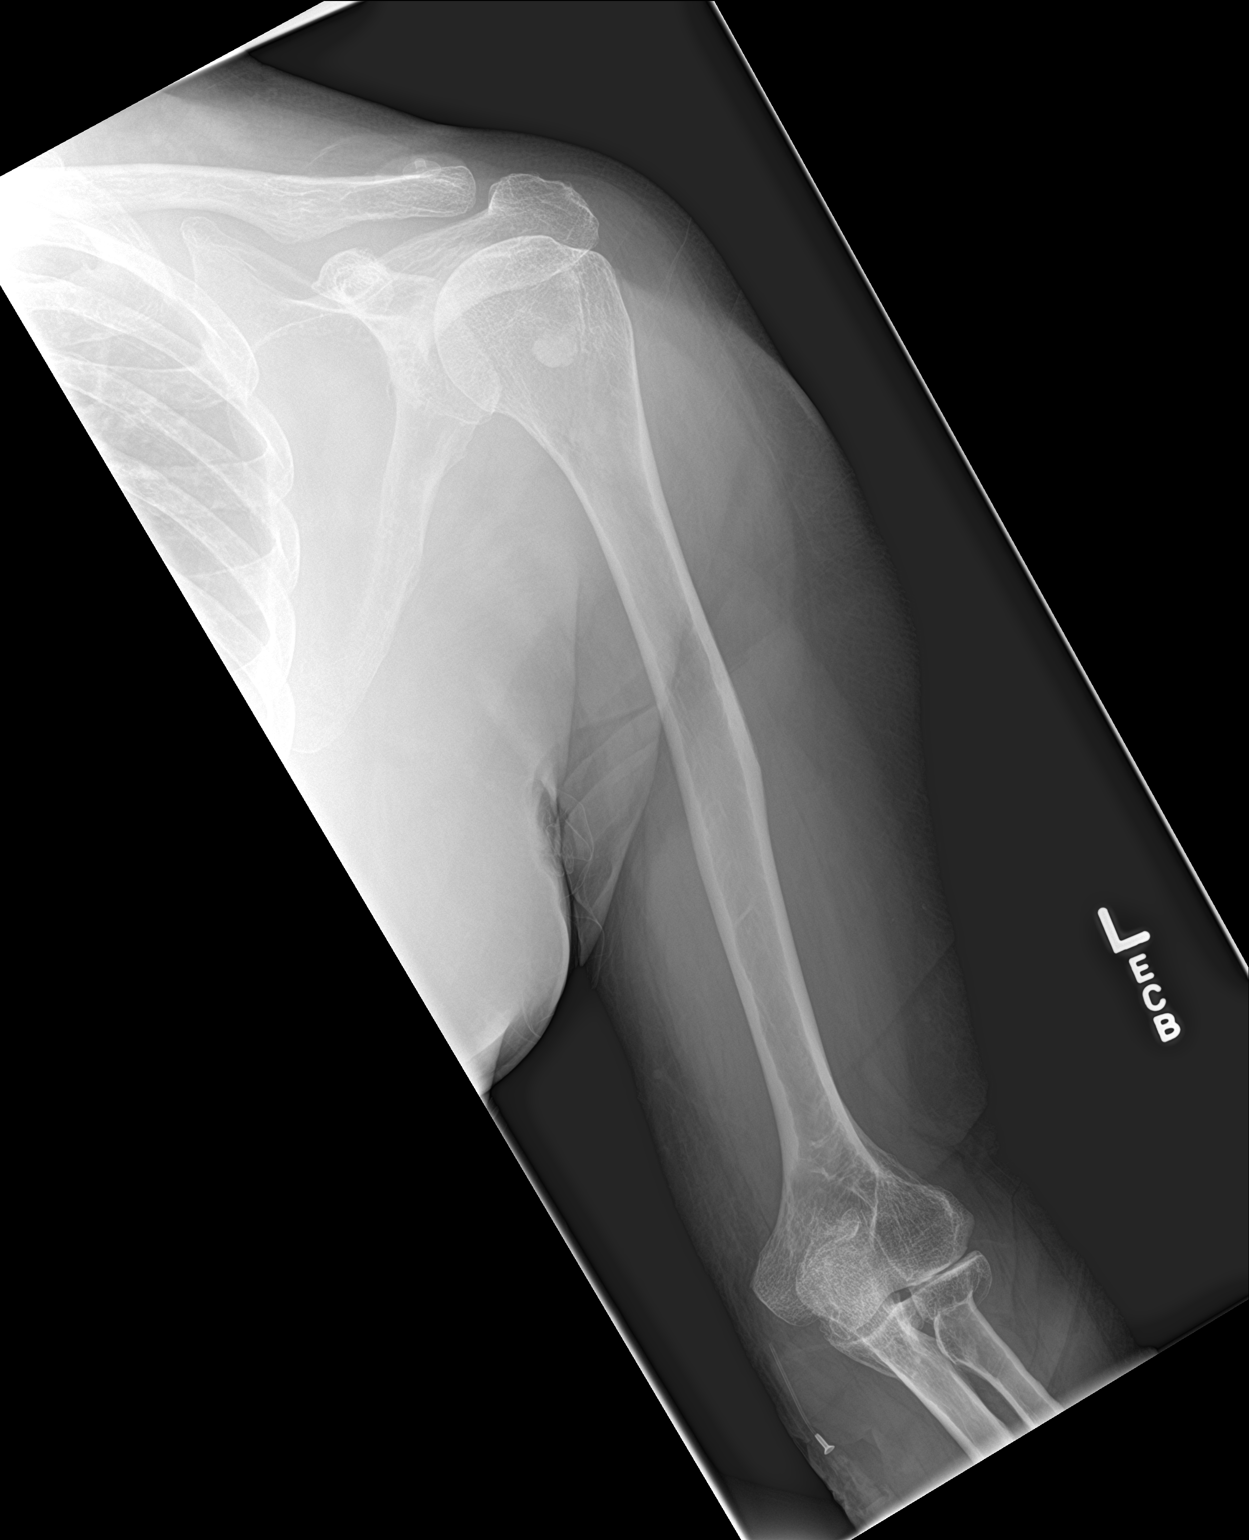

[humerus lat]
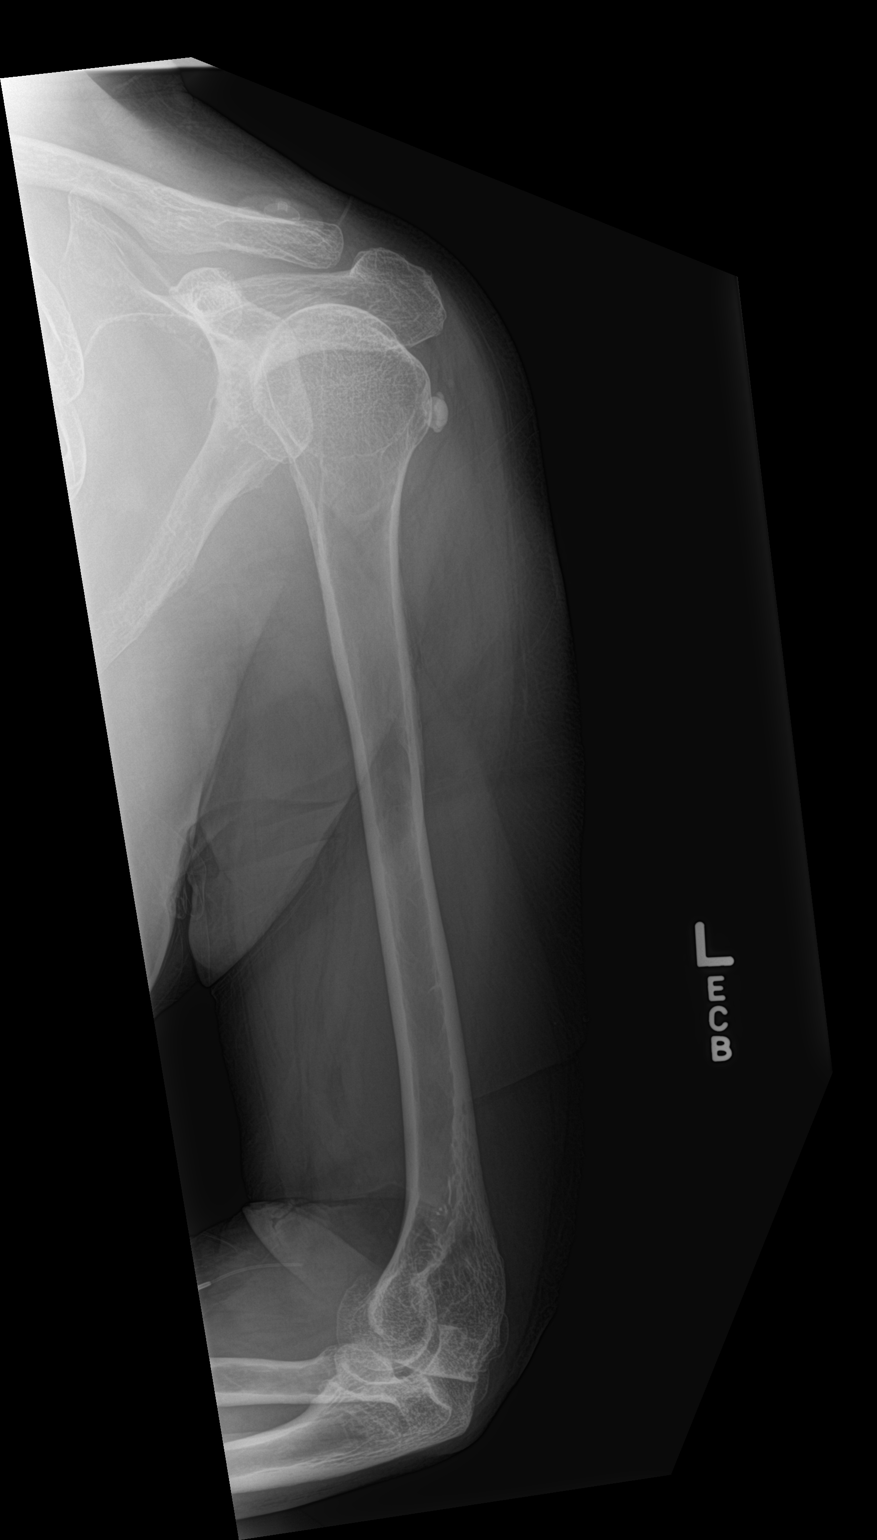

[2 of 2 positions shown; findings below may reference images not displayed]

FINDINGS: There is no evidence of fracture or dislocation. There is chronic
deformity of the distal humerus, and proximal radius and ulna. No
definite elbow joint effusion is seen. The left humeral head remains
seated at the glenoid fossa. Calcification overlying the left
humeral head may reflect calcific tendinitis.

The left acromioclavicular joint is unremarkable. No definite soft
abnormalities are characterized on radiograph.
IMPRESSION: 1. No evidence of fracture or dislocation.
2. Chronic deformity of the distal humerus, and proximal radius and
ulna.
3. Calcification overlying the left humeral head may reflect
calcific tendinitis.

## 2023-01-14 ENCOUNTER — Encounter (HOSPITAL_COMMUNITY): Payer: Self-pay

## 2023-01-14 ENCOUNTER — Emergency Department (HOSPITAL_COMMUNITY)
Admission: EM | Admit: 2023-01-14 | Discharge: 2023-01-15 | Disposition: A | Discharge status: Home / Self Care | Payer: Managed Care, Other (non HMO) | Attending: Emergency Medicine | Admitting: Emergency Medicine

## 2023-01-14 ENCOUNTER — Emergency Department (HOSPITAL_COMMUNITY): Payer: Managed Care, Other (non HMO)

## 2023-01-14 DIAGNOSIS — Z7982 Long term (current) use of aspirin: Secondary | ICD-10-CM | POA: Diagnosis not present

## 2023-01-14 DIAGNOSIS — Z79899 Other long term (current) drug therapy: Secondary | ICD-10-CM | POA: Insufficient documentation

## 2023-01-14 DIAGNOSIS — Y92410 Unspecified street and highway as the place of occurrence of the external cause: Secondary | ICD-10-CM | POA: Diagnosis not present

## 2023-01-14 DIAGNOSIS — M542 Cervicalgia: Secondary | ICD-10-CM | POA: Insufficient documentation

## 2023-01-14 DIAGNOSIS — Z7984 Long term (current) use of oral hypoglycemic drugs: Secondary | ICD-10-CM | POA: Diagnosis not present

## 2023-01-14 DIAGNOSIS — S0990XA Unspecified injury of head, initial encounter: Secondary | ICD-10-CM | POA: Insufficient documentation

## 2023-01-14 DIAGNOSIS — E119 Type 2 diabetes mellitus without complications: Secondary | ICD-10-CM | POA: Insufficient documentation

## 2023-01-14 DIAGNOSIS — N3001 Acute cystitis with hematuria: Secondary | ICD-10-CM | POA: Insufficient documentation

## 2023-01-14 DIAGNOSIS — S299XXA Unspecified injury of thorax, initial encounter: Secondary | ICD-10-CM | POA: Insufficient documentation

## 2023-01-14 DIAGNOSIS — I10 Essential (primary) hypertension: Secondary | ICD-10-CM | POA: Diagnosis not present

## 2023-01-14 LAB — COMPREHENSIVE METABOLIC PANEL
ALT: 25 U/L (ref 0–44)
AST: 34 U/L (ref 15–41)
Albumin: 3.9 g/dL (ref 3.5–5.0)
Alkaline Phosphatase: 55 U/L (ref 38–126)
Anion gap: 11 (ref 5–15)
BUN: 43 mg/dL — ABNORMAL HIGH (ref 8–23)
CO2: 18 mmol/L — ABNORMAL LOW (ref 22–32)
Calcium: 9.1 mg/dL (ref 8.9–10.3)
Chloride: 109 mmol/L (ref 98–111)
Creatinine, Ser: 1.49 mg/dL — ABNORMAL HIGH (ref 0.44–1.00)
GFR, Estimated: 39 mL/min — ABNORMAL LOW (ref >60)
Glucose, Bld: 86 mg/dL (ref 70–99)
Potassium: 4.8 mmol/L (ref 3.5–5.1)
Sodium: 138 mmol/L (ref 135–145)
Total Bilirubin: 1 mg/dL (ref 0.3–1.2)
Total Protein: 6.9 g/dL (ref 6.5–8.1)

## 2023-01-14 LAB — RAPID URINE DRUG SCREEN, HOSP PERFORMED
Amphetamines: NOT DETECTED
Barbiturates: NOT DETECTED
Benzodiazepines: NOT DETECTED
Cocaine: NOT DETECTED
Opiates: NOT DETECTED
Tetrahydrocannabinol: NOT DETECTED

## 2023-01-14 LAB — URINALYSIS, ROUTINE W REFLEX MICROSCOPIC
Bilirubin Urine: NEGATIVE
Glucose, UA: NEGATIVE mg/dL
Ketones, ur: NEGATIVE mg/dL
Nitrite: NEGATIVE
Protein, ur: 30 mg/dL — AB
Specific Gravity, Urine: 1.013 (ref 1.005–1.030)
pH: 5 (ref 5.0–8.0)

## 2023-01-14 LAB — CBC
HCT: 40.3 % (ref 36.0–46.0)
Hemoglobin: 13.8 g/dL (ref 12.0–15.0)
MCH: 32.8 pg (ref 26.0–34.0)
MCHC: 34.2 g/dL (ref 30.0–36.0)
MCV: 95.7 fL (ref 80.0–100.0)
Platelets: 240 10*3/uL (ref 150–400)
RBC: 4.21 MIL/uL (ref 3.87–5.11)
RDW: 13.2 % (ref 11.5–15.5)
WBC: 9.9 10*3/uL (ref 4.0–10.5)
nRBC: 0 % (ref 0.0–0.2)

## 2023-01-14 LAB — ETHANOL: Alcohol, Ethyl (B): <10 mg/dL (ref <10)

## 2023-01-14 MED ORDER — ACETAMINOPHEN 500 MG PO TABS
1000.0000 mg | ORAL_TABLET | ORAL | Status: AC
  Administered 2023-01-14: 1000 mg via ORAL
  Filled 2023-01-14: qty 2

## 2023-01-14 MED ORDER — IOHEXOL 350 MG/ML SOLN
75.0000 mL | Freq: Once | INTRAVENOUS | Status: AC | PRN
  Administered 2023-01-14: 75 mL via INTRAVENOUS

## 2023-01-14 MED ORDER — CEPHALEXIN 500 MG PO CAPS: 500.0000 mg | ORAL_CAPSULE | Freq: Two times a day (BID) | ORAL | 0 refills | Status: AC
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
## 2023-01-14 NOTE — ED Notes (Signed)
Patient to CT.

## 2023-01-14 NOTE — ED Notes (Signed)
Patient back from CT VS obtained
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
## 2023-01-15 MED ORDER — CYCLOBENZAPRINE HCL 10 MG PO TABS: 10.0000 mg | ORAL_TABLET | Freq: Two times a day (BID) | ORAL | 0 refills | Status: AC | PRN

## 2023-01-15 NOTE — ED Notes (Signed)
Patient getting dressed, clothing returned
